# Patient Record
Sex: Female | Born: 1965 | Race: White | Hispanic: No | Marital: Married | Smoking: Never smoker
Health system: Southern US, Community
[De-identification: ages and names within clinical notes are randomized; demographics above are authoritative.]

## PROBLEM LIST (undated history)

## (undated) DIAGNOSIS — Z789 Other specified health status: Secondary | ICD-10-CM

## (undated) HISTORY — PX: WISDOM TOOTH EXTRACTION: SHX21

---

## 1999-04-08 ENCOUNTER — Other Ambulatory Visit: Admission: RE | Admit: 1999-04-08 | Discharge: 1999-04-08 | Payer: Self-pay | Admitting: Obstetrics and Gynecology

## 2001-05-29 ENCOUNTER — Other Ambulatory Visit: Admission: RE | Admit: 2001-05-29 | Discharge: 2001-05-29 | Payer: Self-pay | Admitting: Obstetrics and Gynecology

## 2003-09-12 ENCOUNTER — Other Ambulatory Visit: Admission: RE | Admit: 2003-09-12 | Discharge: 2003-09-12 | Payer: Self-pay | Admitting: Obstetrics and Gynecology

## 2006-07-06 ENCOUNTER — Ambulatory Visit (HOSPITAL_COMMUNITY): Admission: RE | Admit: 2006-07-06 | Discharge: 2006-07-06 | Payer: Self-pay | Admitting: Family Medicine

## 2008-03-26 ENCOUNTER — Other Ambulatory Visit: Admission: RE | Admit: 2008-03-26 | Discharge: 2008-03-26 | Payer: Self-pay | Admitting: Obstetrics and Gynecology

## 2008-05-06 IMAGING — CT CT CHEST W/ CM
1 series · 15 of 31 positions shown, 19 images · IV contrast (omnipaque)
Comparison: none

CLINICAL DATA: Abnormal chest x-ray.  Enlarged aorta. 
CHEST CT WITH CONTRAST:
TECHNIQUE: Multidetector CT imaging of the chest was performed following the standard protocol during bolus administration of intravenous contrast.
Contrast:  80 cc Omnipaque 300

[Series 2: chest_routine 5.0 b40f st · axial · 0.59mm/px · z∈[-332,-17]mm · 15 of 69 slices shown, 19 images]
[im 3/69  mediastinal]
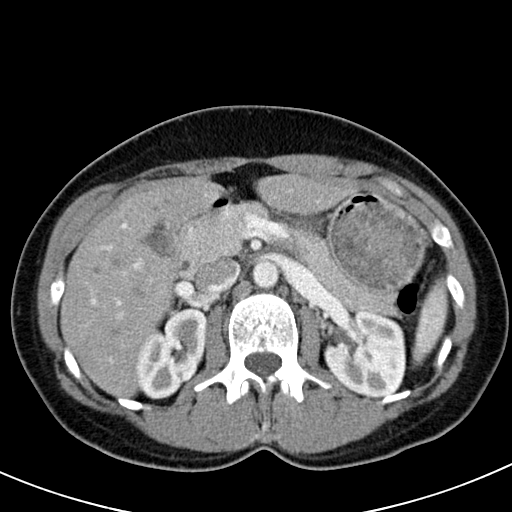
[im 3/69  lung]
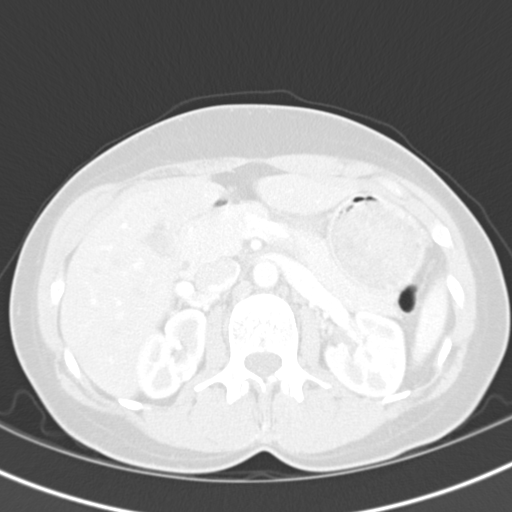
[im 8/69  lung]
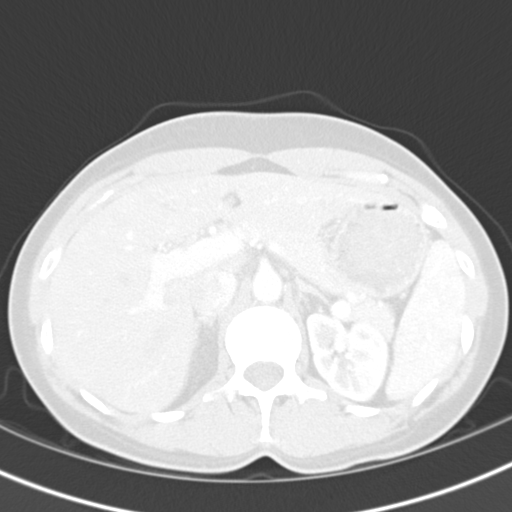
[im 13/69  lung]
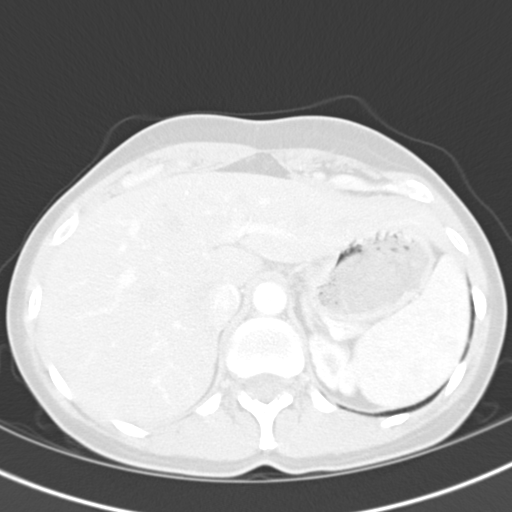
[im 16/69  lung]
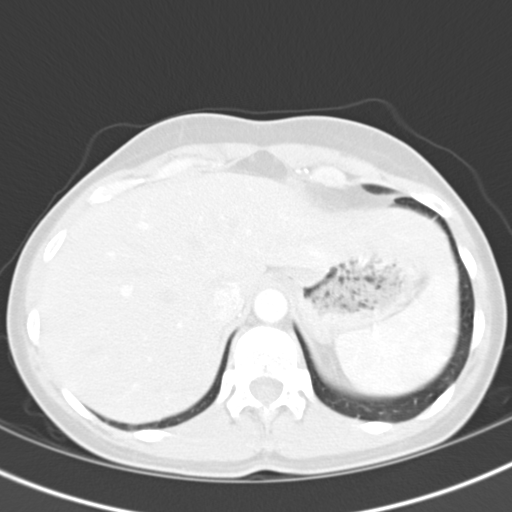
[im 21/69  mediastinal]
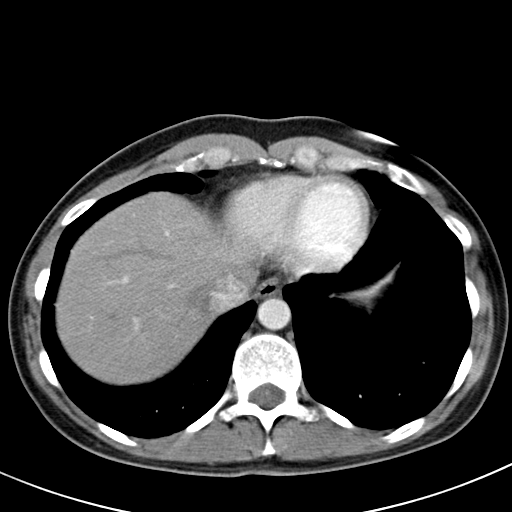
[im 21/69  lung]
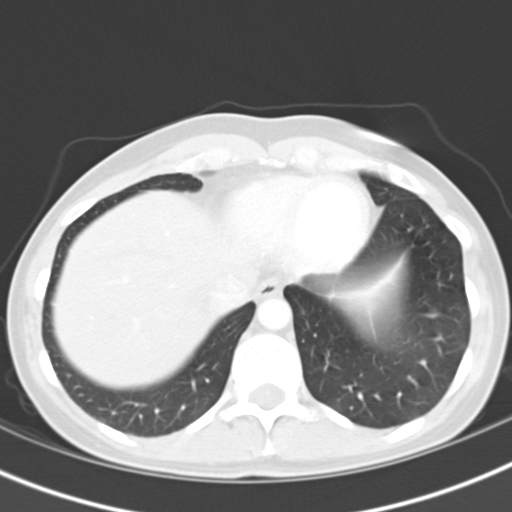
[im 26/69  lung]
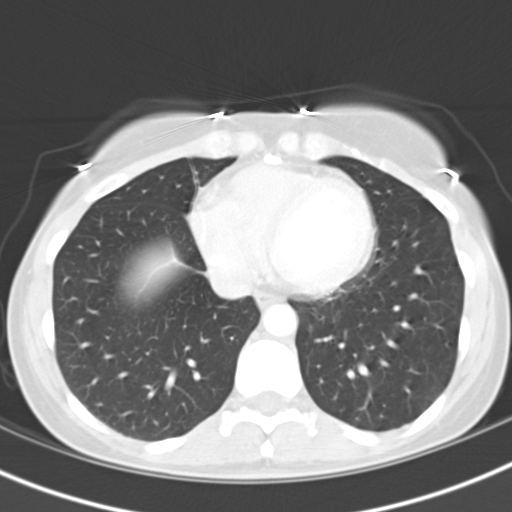
[im 31/69  lung]
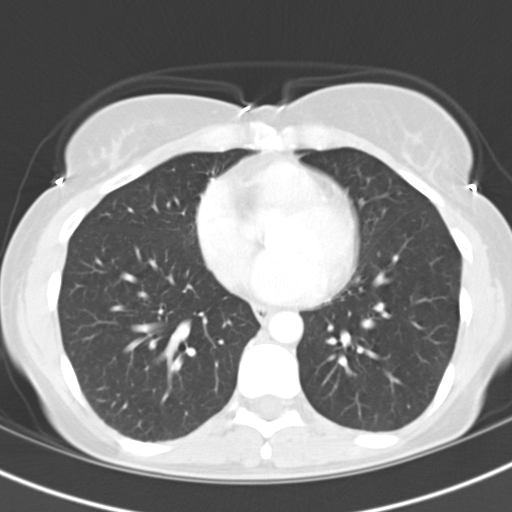
[im 36/69  lung]
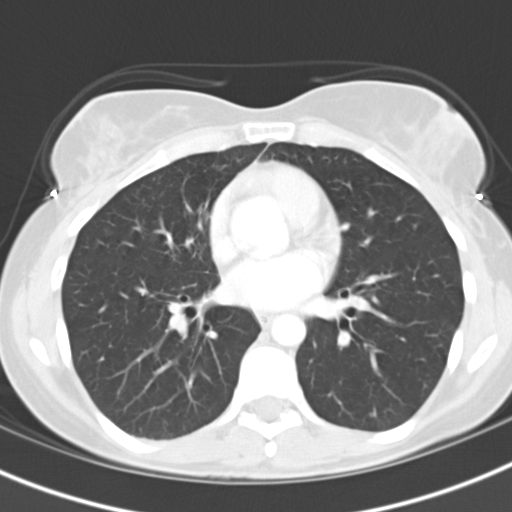
[im 38/69  mediastinal]
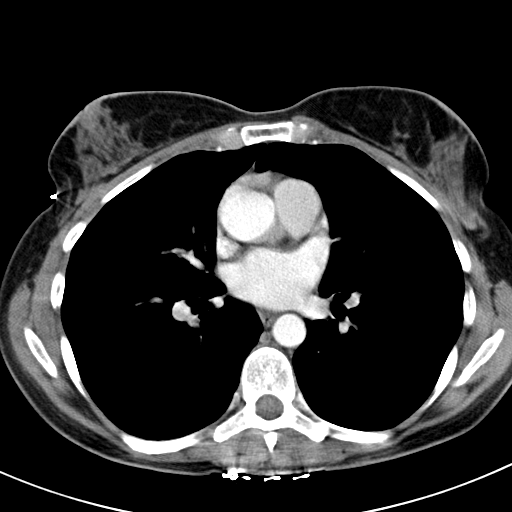
[im 38/69  lung]
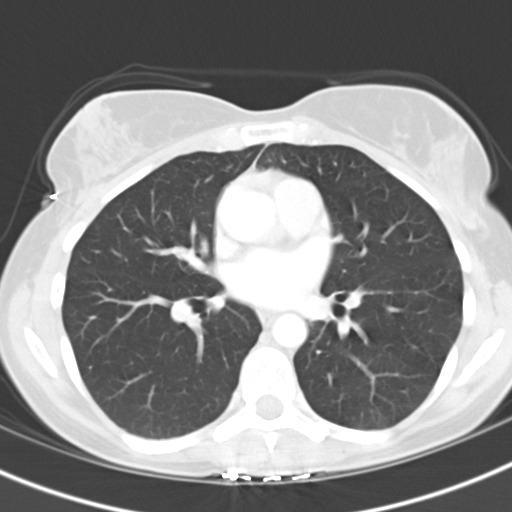
[im 43/69  lung]
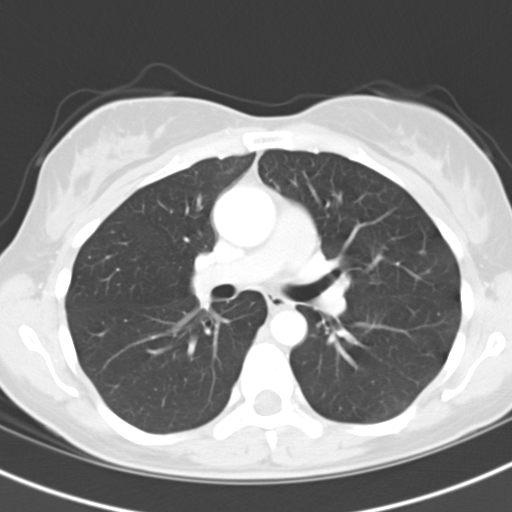
[im 48/69  lung]
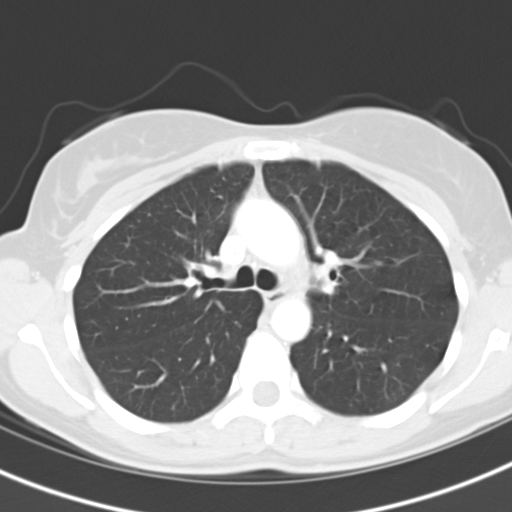
[im 53/69  lung]
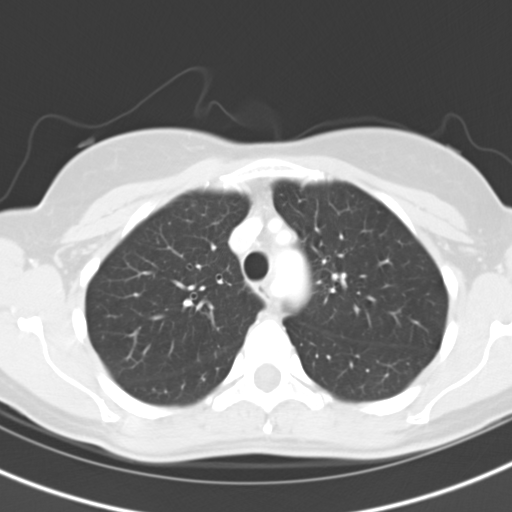
[im 56/69  mediastinal]
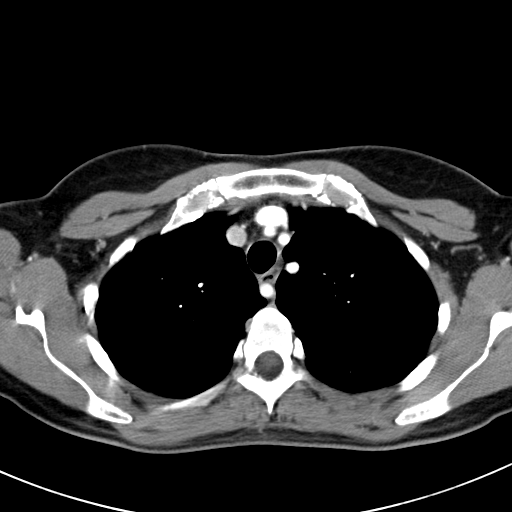
[im 56/69  lung]
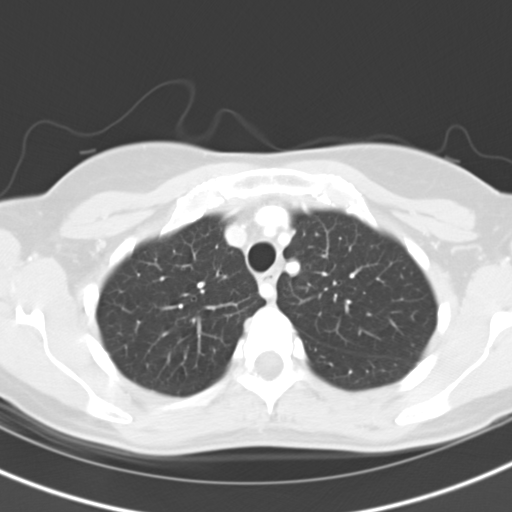
[im 61/69  lung]
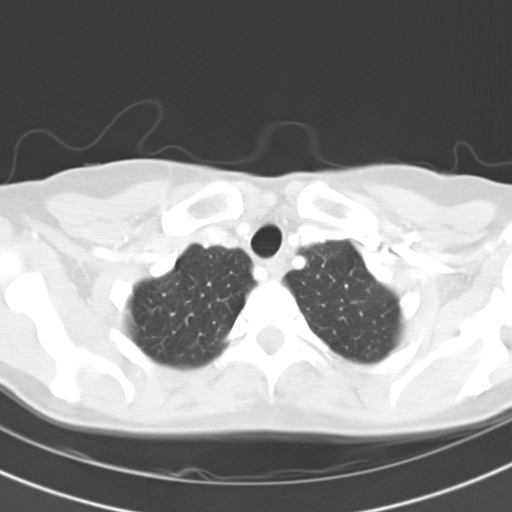
[im 66/69  lung]
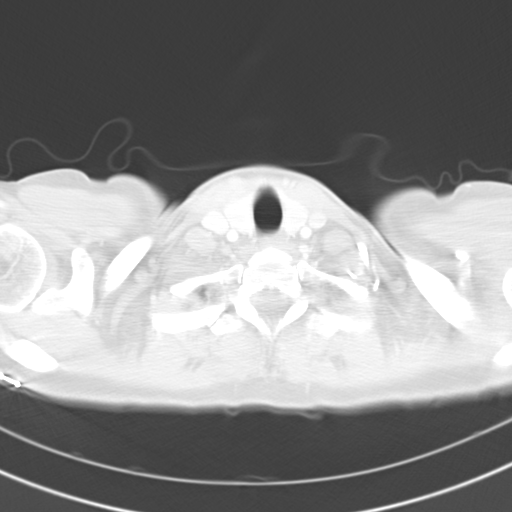

[15 of 31 positions shown; findings below may reference images not displayed]

FINDINGS: The lungs are clear.  No pleural or pericardial fluid.  No hilar or mediastinal adenopathy.  The pulmonary vessels are normal.  The patient has an anomalous origin of the right subclavian artery.  It is possible this could relate to the chest radiographic pattern, though I do not have that for comparison.   Otherwise, the aorta is normal.  No osseous lesion is seen of an acute nature.  There appears to be a chronic calcified thoracic disk herniation in the upper to mid-thoracic region that would not be expected to cause neural compression.
IMPRESSION: No active pathology.  The patient has the congenital variation of an anomalous origin of the right subclavian artery.  It is possible this could result in an abnormal chest radiographic appearance but I do not have those actual images for comparison.  Otherwise, the aorta is normal.

## 2010-05-09 HISTORY — PX: DILATION AND CURETTAGE OF UTERUS: SHX78

## 2010-05-09 NOTE — L&D Delivery Note (Signed)
Delivery Note At 1:18 AM a viable and healthy female was delivered via Vaginal, Spontaneous Delivery (Presentation: Middle Occiput Anterior).  APGAR: , ; weight 5 lb 13.8 oz (2660 g).   Placenta status: Adherent, Manual removal.  Cord: 3 vessels   Pt delivered a vigorous female infant in vertex presentation weighing 5# 13.8 oz with apgars 8 and 9.  Infant cried vigorously after delivery and was passed to maternal abdomen. Cord was clamped and cut.  Placenta was adherant to the fundus and required manual extraction.  Cord insertion appeared to be velamentous.  No lacerations required repair.  Mother and baby are doing well after delivery.  1gram of ancef IV x 1 d/t manual placental extraction  Anesthesia: Epidural  Episiotomy: None Lacerations: abrasion. No repair required  Est. Blood Loss (mL): 450  Mom to postpartum.  Baby to nursery-stable.  Charlcie Prisco H. 03/05/2011, 2:12 AM

## 2010-08-31 LAB — HEPATITIS B SURFACE ANTIGEN: Hepatitis B Surface Ag: NEGATIVE

## 2010-08-31 LAB — ABO/RH: RH Type: POSITIVE

## 2010-08-31 LAB — CBC: Hemoglobin: 14.1 g/dL (ref 12.0–16.0)

## 2010-08-31 LAB — GC/CHLAMYDIA PROBE AMP, GENITAL
Chlamydia: NEGATIVE
Gonorrhea: NEGATIVE

## 2010-08-31 LAB — RUBELLA ANTIBODY, IGM: Rubella: IMMUNE

## 2010-08-31 LAB — ANTIBODY SCREEN: Antibody Screen: NEGATIVE

## 2010-12-23 LAB — CBC: HCT: 41 % (ref 36–46)

## 2010-12-23 LAB — RPR: RPR: NONREACTIVE

## 2011-02-25 ENCOUNTER — Encounter (HOSPITAL_COMMUNITY): Payer: Self-pay

## 2011-02-25 ENCOUNTER — Observation Stay (HOSPITAL_COMMUNITY)
Admission: AD | Admit: 2011-02-25 | Discharge: 2011-02-26 | Disposition: A | Discharge status: Home / Self Care | Payer: BC Managed Care – PPO | Source: Ambulatory Visit | Attending: Obstetrics and Gynecology | Admitting: Obstetrics and Gynecology

## 2011-02-25 DIAGNOSIS — Z3480 Encounter for supervision of other normal pregnancy, unspecified trimester: Secondary | ICD-10-CM

## 2011-02-25 DIAGNOSIS — O09519 Supervision of elderly primigravida, unspecified trimester: Secondary | ICD-10-CM | POA: Insufficient documentation

## 2011-02-25 DIAGNOSIS — O139 Gestational [pregnancy-induced] hypertension without significant proteinuria, unspecified trimester: Principal | ICD-10-CM | POA: Insufficient documentation

## 2011-02-25 DIAGNOSIS — O169 Unspecified maternal hypertension, unspecified trimester: Secondary | ICD-10-CM | POA: Diagnosis present

## 2011-02-25 HISTORY — DX: Other specified health status: Z78.9

## 2011-02-25 LAB — CBC
HCT: 39.8 % (ref 36.0–46.0)
Hemoglobin: 13.5 g/dL (ref 12.0–15.0)
MCH: 31.5 pg (ref 26.0–34.0)
Platelets: 207 10*3/uL (ref 150–400)
RBC: 4.28 MIL/uL (ref 3.87–5.11)
RDW: 13.1 % (ref 11.5–15.5)
WBC: 11.1 10*3/uL — ABNORMAL HIGH (ref 4.0–10.5)

## 2011-02-25 LAB — URINALYSIS, ROUTINE W REFLEX MICROSCOPIC
Bilirubin Urine: NEGATIVE
Glucose, UA: NEGATIVE mg/dL
Ketones, ur: NEGATIVE mg/dL
Leukocytes, UA: NEGATIVE
Nitrite: NEGATIVE
Specific Gravity, Urine: 1.01 (ref 1.005–1.030)
pH: 7 (ref 5.0–8.0)

## 2011-02-25 LAB — COMPREHENSIVE METABOLIC PANEL
ALT: 11 U/L (ref 0–35)
AST: 20 U/L (ref 0–37)
Potassium: 4.1 mEq/L (ref 3.5–5.1)
Sodium: 135 mEq/L (ref 135–145)
Total Protein: 6 g/dL (ref 6.0–8.3)

## 2011-02-25 LAB — URINE MICROSCOPIC-ADD ON

## 2011-02-25 NOTE — H&P (Signed)
H & P Pt is a 45 y/o white female, G1P0 at [redacted] weeks EGA who presents to the ER with elevated B/P.  Pt was seen in the office today and sent over for increased blood pressures. PNC has been complicated by AMA. This pregnancy is the result of IVF with donor eggs(23y/0). Pt initially had a twin pregnancy but had a fetal demise of one twin. Pt also had a placenta previa at 20 weeks which resolved(per pt).  Pt has no HA, vision changes, RUQ pain. Her PIH labs are all normal and her NST is reactive, however her B/Ps remain elevated after bedrest.  Pt will be admitted for observation. PMHx: see [facility] PE: B/P 160-150/90-104 HEENT-wnl Abd- gravid, nontender exts- normal reflexes, no clonus NST- reactive  IMP/ IUP at 36 weeks with PIH         AMA         IVF with donor eggs         Fetal demise of one twin PLAN/ 24 observation

## 2011-02-25 NOTE — Progress Notes (Signed)
Pt was seen in the office today for a regular visit and her blood pressure was elevated. Sent to MAU for evaluation. Pt denies any pain, headache, bleeding, leaking and reports good fetal movement.

## 2011-02-26 NOTE — Progress Notes (Signed)
Hospital Day #2 Pt has no complaints. NST reactive. B/Ps stable, majority have been 130-150/84-95. PIH labs all normal. Plan/ Will discharge home on BR and follow up in office on Monday.

## 2011-02-26 NOTE — Discharge Summary (Signed)
  Pt was admitted for 24 observation because of PIH.  She had no symptoms, normal PIH labs, and a reactive NST.  Pt is now only 36 weeks.  PLAN/ Will discharge to home and follow up on Monday. Will continue [facility] and BR at home.

## 2011-03-01 NOTE — Progress Notes (Signed)
UR Chart review completed.  

## 2011-03-03 ENCOUNTER — Encounter (HOSPITAL_COMMUNITY): Payer: Self-pay | Admitting: *Deleted

## 2011-03-03 ENCOUNTER — Inpatient Hospital Stay (HOSPITAL_COMMUNITY)
Admission: AD | Admit: 2011-03-03 | Discharge: 2011-03-07 | DRG: 372 | Disposition: A | Discharge status: Home / Self Care | Payer: BC Managed Care – PPO | Source: Ambulatory Visit | Attending: Obstetrics & Gynecology | Admitting: Obstetrics & Gynecology

## 2011-03-03 DIAGNOSIS — O09519 Supervision of elderly primigravida, unspecified trimester: Secondary | ICD-10-CM | POA: Diagnosis present

## 2011-03-03 DIAGNOSIS — O30009 Twin pregnancy, unspecified number of placenta and unspecified number of amniotic sacs, unspecified trimester: Secondary | ICD-10-CM

## 2011-03-03 DIAGNOSIS — IMO0002 Reserved for concepts with insufficient information to code with codable children: Principal | ICD-10-CM | POA: Diagnosis present

## 2011-03-03 DIAGNOSIS — Z22330 Carrier of Group B streptococcus: Secondary | ICD-10-CM

## 2011-03-03 DIAGNOSIS — O99892 Other specified diseases and conditions complicating childbirth: Secondary | ICD-10-CM | POA: Diagnosis present

## 2011-03-03 LAB — COMPREHENSIVE METABOLIC PANEL
ALT: 18 U/L (ref 0–35)
AST: 22 U/L (ref 0–37)
Albumin: 2.4 g/dL — ABNORMAL LOW (ref 3.5–5.2)
CO2: 23 mEq/L (ref 19–32)
Chloride: 103 mEq/L (ref 96–112)
GFR calc non Af Amer: 85 mL/min — ABNORMAL LOW (ref >90)
Sodium: 133 mEq/L — ABNORMAL LOW (ref 135–145)
Total Bilirubin: 0.3 mg/dL (ref 0.3–1.2)

## 2011-03-03 LAB — PROTEIN, URINE, 24 HOUR
Collection Interval-UPROT: 24 hours
Protein, 24H Urine: 459 mg/d — ABNORMAL HIGH (ref 50–100)

## 2011-03-03 LAB — CBC
Platelets: 200 10*3/uL (ref 150–400)
RBC: 4.46 MIL/uL (ref 3.87–5.11)
WBC: 12.6 10*3/uL — ABNORMAL HIGH (ref 4.0–10.5)

## 2011-03-03 LAB — STREP B DNA PROBE: GBS: POSITIVE

## 2011-03-03 LAB — URIC ACID: Uric Acid, Serum: 5.8 mg/dL (ref 2.4–7.0)

## 2011-03-03 MED ORDER — ACETAMINOPHEN 325 MG PO TABS: 650.0000 mg | ORAL_TABLET | ORAL | Status: DC | PRN

## 2011-03-03 MED ORDER — OXYTOCIN BOLUS FROM INFUSION
500.0000 mL | Freq: Once | INTRAVENOUS | Status: DC
  Filled 2011-03-03: qty 1000
  Filled 2011-03-03: qty 500

## 2011-03-03 MED ORDER — DOCUSATE SODIUM 100 MG PO CAPS
100.0000 mg | ORAL_CAPSULE | Freq: Every day | ORAL | Status: DC
  Administered 2011-03-05 (×2): 100 mg via ORAL
  Filled 2011-03-03: qty 1

## 2011-03-03 MED ORDER — PENICILLIN G POTASSIUM 5000000 UNITS IJ SOLR
2.5000 10*6.[IU] | INTRAVENOUS | Status: DC
  Administered 2011-03-04 (×6): 2.5 10*6.[IU] via INTRAVENOUS
  Filled 2011-03-03 (×14): qty 2.5

## 2011-03-03 MED ORDER — LACTATED RINGERS IV SOLN
INTRAVENOUS | Status: DC
  Administered 2011-03-03: 125 mL/h via INTRAVENOUS
  Administered 2011-03-04: 08:00:00 via INTRAVENOUS
  Administered 2011-03-04: 125 mL/h via INTRAVENOUS
  Administered 2011-03-04 (×2): via INTRAVENOUS
  Administered 2011-03-05: 500 mL/h via INTRAVENOUS
  Administered 2011-03-05: 07:00:00 via INTRAVENOUS

## 2011-03-03 MED ORDER — CALCIUM CARBONATE ANTACID 500 MG PO CHEW: 2.0000 | CHEWABLE_TABLET | ORAL | Status: DC | PRN

## 2011-03-03 MED ORDER — CITRIC ACID-SODIUM CITRATE 334-500 MG/5ML PO SOLN: 30.0000 mL | ORAL | Status: DC | PRN

## 2011-03-03 MED ORDER — BUTORPHANOL TARTRATE 2 MG/ML IJ SOLN: 1.0000 mg | INTRAMUSCULAR | Status: DC | PRN

## 2011-03-03 MED ORDER — ONDANSETRON HCL 4 MG/2ML IJ SOLN: 4.0000 mg | Freq: Four times a day (QID) | INTRAMUSCULAR | Status: DC | PRN

## 2011-03-03 MED ORDER — OXYCODONE-ACETAMINOPHEN 5-325 MG PO TABS
2.0000 | ORAL_TABLET | ORAL | Status: DC | PRN
  Administered 2011-03-05: 2 via ORAL
  Filled 2011-03-03 (×2): qty 2

## 2011-03-03 MED ORDER — ZOLPIDEM TARTRATE 10 MG PO TABS
10.0000 mg | ORAL_TABLET | Freq: Every evening | ORAL | Status: DC | PRN
  Filled 2011-03-03: qty 1

## 2011-03-03 MED ORDER — OXYTOCIN 20 UNITS IN LACTATED RINGERS INFUSION - SIMPLE
125.0000 mL/h | Freq: Once | INTRAVENOUS | Status: DC
  Filled 2011-03-03: qty 1000

## 2011-03-03 MED ORDER — MISOPROSTOL 25 MCG QUARTER TABLET
25.0000 ug | ORAL_TABLET | ORAL | Status: DC | PRN
  Administered 2011-03-03 (×2): 25 ug via VAGINAL
  Filled 2011-03-03 (×2): qty 0.25
  Filled 2011-03-03: qty 1

## 2011-03-03 MED ORDER — LACTATED RINGERS IV SOLN
500.0000 mL | INTRAVENOUS | Status: DC | PRN
  Administered 2011-03-04: 1000 mL via INTRAVENOUS

## 2011-03-03 MED ORDER — PENICILLIN G POTASSIUM 5000000 UNITS IJ SOLR
5.0000 10*6.[IU] | Freq: Once | INTRAMUSCULAR | Status: AC
  Administered 2011-03-03: 5 10*6.[IU] via INTRAVENOUS
  Filled 2011-03-03: qty 5

## 2011-03-03 MED ORDER — FLEET ENEMA 7-19 GM/118ML RE ENEM: 1.0000 | ENEMA | RECTAL | Status: DC | PRN

## 2011-03-03 MED ORDER — TERBUTALINE SULFATE 1 MG/ML IJ SOLN: 0.2500 mg | Freq: Once | INTRAMUSCULAR | Status: AC | PRN

## 2011-03-03 MED ORDER — IBUPROFEN 600 MG PO TABS
600.0000 mg | ORAL_TABLET | Freq: Four times a day (QID) | ORAL | Status: DC | PRN
  Administered 2011-03-05: 600 mg via ORAL
  Filled 2011-03-03 (×5): qty 1

## 2011-03-03 MED ORDER — PRENATAL PLUS 27-1 MG PO TABS
1.0000 | ORAL_TABLET | Freq: Every day | ORAL | Status: DC
  Administered 2011-03-05 (×2): 1 via ORAL
  Filled 2011-03-03: qty 1

## 2011-03-03 MED ORDER — LIDOCAINE HCL (PF) 1 % IJ SOLN
30.0000 mL | INTRAMUSCULAR | Status: DC | PRN
  Filled 2011-03-03 (×2): qty 30

## 2011-03-03 MED ORDER — ZOLPIDEM TARTRATE 10 MG PO TABS
10.0000 mg | ORAL_TABLET | Freq: Every evening | ORAL | Status: DC | PRN
  Administered 2011-03-04: 10 mg via ORAL

## 2011-03-03 MED ORDER — OXYTOCIN 20 UNITS IN LACTATED RINGERS INFUSION - SIMPLE
1.0000 m[IU]/min | INTRAVENOUS | Status: DC
  Administered 2011-03-04: 10 m[IU]/min via INTRAVENOUS
  Administered 2011-03-04: 3 m[IU]/min via INTRAVENOUS
  Administered 2011-03-04: 1 m[IU]/min via INTRAVENOUS
  Administered 2011-03-05: 166.667 m[IU]/min via INTRAVENOUS
  Administered 2011-03-05: 333 m[IU]/min via INTRAVENOUS
  Filled 2011-03-03: qty 1000

## 2011-03-03 NOTE — Progress Notes (Signed)
BP remains elevated and 24 hour U/A Protein 459.  Have discussed with Dr. Sherrie George and Dr. Fredia Sorrow and will begin induction this PM with cytotec.  Will transfer to L&D around 7PM.

## 2011-03-03 NOTE — Progress Notes (Signed)
Dr. Aldona Bar at the bedside and notifed of pt status, FHR, UC pattern, SVE, and maternal BP. Orders received. Will continue to monitor.

## 2011-03-03 NOTE — H&P (Signed)
45 year old G1 at 36 weeks and 6 days ([facility] 11/16) admitted for Preeclampsia.  Admitted 1 week ago for 24 hours for same.  In office on 10/22 BP140/98.  Today BP 150-160/100, 1+ Proteinuria.  NST in office reactive.  No significant other symptoms.   This pregnancy was IVF with Donor Eggs and initially was a Twin Pregnancy but there was demise of one. +GBS  No allergies Only med is vitamins.  PE  BP 150/100 Chest Clear CV-  RR no murmur ABD-  S+35  VTX  Reactive NST (in office today) EXT:  No edema, Reflexes normal  IMP:  36 week and 6 day IUP  AMA/IVF Pregnancy  Preeclampsia  +GBS  Plan:  Admit to Antenatal.  24 hr. Total Protein submitted.  Labs sent.  Have discussed with MFM (Dr. Sherrie George) and plan is to consider induction soon--today or tomorrow, pending labs, etc.

## 2011-03-04 ENCOUNTER — Inpatient Hospital Stay (HOSPITAL_COMMUNITY): Payer: BC Managed Care – PPO | Admitting: Anesthesiology

## 2011-03-04 ENCOUNTER — Encounter (HOSPITAL_COMMUNITY): Payer: Self-pay | Admitting: Anesthesiology

## 2011-03-04 LAB — COMPREHENSIVE METABOLIC PANEL
AST: 27 U/L (ref 0–37)
CO2: 21 mEq/L (ref 19–32)
Creatinine, Ser: 0.76 mg/dL (ref 0.50–1.10)
GFR calc non Af Amer: >90 mL/min (ref >90)
Potassium: 4.2 mEq/L (ref 3.5–5.1)
Sodium: 132 mEq/L — ABNORMAL LOW (ref 135–145)
Total Bilirubin: 0.3 mg/dL (ref 0.3–1.2)

## 2011-03-04 LAB — CBC
MCH: 31.3 pg (ref 26.0–34.0)
MCH: 30.9 pg (ref 26.0–34.0)
MCHC: 33.7 g/dL (ref 30.0–36.0)
MCHC: 33.6 g/dL (ref 30.0–36.0)
Platelets: 187 10*3/uL (ref 150–400)
RBC: 4.43 MIL/uL (ref 3.87–5.11)
RDW: 13.2 % (ref 11.5–15.5)

## 2011-03-04 LAB — RPR: RPR Ser Ql: NONREACTIVE

## 2011-03-04 MED ORDER — LACTATED RINGERS IV SOLN
500.0000 mL | Freq: Once | INTRAVENOUS | Status: AC
  Administered 2011-03-04: 500 mL via INTRAVENOUS

## 2011-03-04 MED ORDER — EPHEDRINE 5 MG/ML INJ
10.0000 mg | INTRAVENOUS | Status: DC | PRN
  Filled 2011-03-04 (×2): qty 4

## 2011-03-04 MED ORDER — DIPHENHYDRAMINE HCL 50 MG/ML IJ SOLN: 12.5000 mg | INTRAMUSCULAR | Status: DC | PRN

## 2011-03-04 MED ORDER — LIDOCAINE HCL 1.5 % IJ SOLN
INTRAMUSCULAR | Status: DC | PRN
  Administered 2011-03-04 (×2): 5 mL via EPIDURAL

## 2011-03-04 MED ORDER — LABETALOL HCL 5 MG/ML IV SOLN
10.0000 mg | Freq: Once | INTRAVENOUS | Status: AC
  Administered 2011-03-04: 10 mg via INTRAVENOUS
  Filled 2011-03-04: qty 4

## 2011-03-04 MED ORDER — FENTANYL 2.5 MCG/ML BUPIVACAINE 1/10 % EPIDURAL INFUSION (WH - ANES)
14.0000 mL/h | INTRAMUSCULAR | Status: DC
  Administered 2011-03-04 (×3): 14 mL/h via EPIDURAL
  Filled 2011-03-04 (×4): qty 60

## 2011-03-04 MED ORDER — PHENYLEPHRINE 40 MCG/ML (10ML) SYRINGE FOR IV PUSH (FOR BLOOD PRESSURE SUPPORT)
80.0000 ug | PREFILLED_SYRINGE | INTRAVENOUS | Status: DC | PRN
  Filled 2011-03-04: qty 5

## 2011-03-04 MED ORDER — EPHEDRINE 5 MG/ML INJ
10.0000 mg | INTRAVENOUS | Status: DC | PRN
  Filled 2011-03-04: qty 4

## 2011-03-04 MED ORDER — PHENYLEPHRINE 40 MCG/ML (10ML) SYRINGE FOR IV PUSH (FOR BLOOD PRESSURE SUPPORT)
80.0000 ug | PREFILLED_SYRINGE | INTRAVENOUS | Status: DC | PRN
  Filled 2011-03-04 (×2): qty 5

## 2011-03-04 MED ORDER — FENTANYL 2.5 MCG/ML BUPIVACAINE 1/10 % EPIDURAL INFUSION (WH - ANES)
INTRAMUSCULAR | Status: DC | PRN
  Administered 2011-03-04: 14 mL/h via EPIDURAL

## 2011-03-04 NOTE — Anesthesia Preprocedure Evaluation (Signed)
Anesthesia Evaluation  Patient identified by MRN, date of birth, ID band Patient awake  General Assessment Comment  Reviewed: Allergy & Precautions, H&P , NPO status , Patient's Chart, lab work & pertinent test results  Airway Mallampati: I TM Distance: >3 FB Neck ROM: full    Dental No notable dental hx.    Pulmonary    Pulmonary exam normal       Cardiovascular     Neuro/Psych Negative Neurological ROS  Negative Psych ROS   GI/Hepatic negative GI ROS Neg liver ROS    Endo/Other  Negative Endocrine ROS  Renal/GU negative Renal ROS  Genitourinary negative   Musculoskeletal negative musculoskeletal ROS (+)   Abdominal Normal abdominal exam  (+)   Peds negative pediatric ROS (+)  Hematology negative hematology ROS (+)   Anesthesia Other Findings   Reproductive/Obstetrics (+) Pregnancy                           Anesthesia Physical Anesthesia Plan  ASA: II  Anesthesia Plan: Epidural   Post-op Pain Management:    Induction:   Airway Management Planned:   Additional Equipment:   Intra-op Plan:   Post-operative Plan:   Informed Consent: I have reviewed the patients History and Physical, chart, labs and discussed the procedure including the risks, benefits and alternatives for the proposed anesthesia with the patient or authorized representative who has indicated his/her understanding and acceptance.     Plan Discussed with:   Anesthesia Plan Comments:         Anesthesia Quick Evaluation  

## 2011-03-04 NOTE — Anesthesia Procedure Notes (Signed)
Epidural Patient location during procedure: OB Start time: 03/04/2011 1:00 PM End time: 03/04/2011 1:06 PM Reason for block: procedure for pain  Staffing Anesthesiologist: Sandrea Hughs Performed by: anesthesiologist   Preanesthetic Checklist Completed: patient identified, site marked, surgical consent, pre-op evaluation, timeout performed, IV checked, risks and benefits discussed and monitors and equipment checked  Epidural Patient position: sitting Prep: site prepped and draped and DuraPrep Patient monitoring: continuous pulse ox and blood pressure Approach: midline Injection technique: LOR air  Needle:  Needle type: Tuohy  Needle gauge: 17 G Needle length: 9 cm Needle insertion depth: 6 cm Catheter type: closed end flexible Catheter size: 19 Gauge Catheter at skin depth: 11 cm Test dose: negative and 1.5% lidocaine  Assessment Sensory level: T8 Events: blood not aspirated, injection not painful, no injection resistance, negative IV test and no paresthesia

## 2011-03-04 NOTE — Progress Notes (Signed)
  Labor Progress Note  Still comfortable Filed Vitals:   03/04/11 2032 03/04/11 2102 03/04/11 2132 03/04/11 2144  BP: 149/84 153/87 150/88 148/91  Pulse: 100 98 96 98  Temp:    97.7 F (36.5 C)  TempSrc:    Oral  Resp: 18 18  18   Height:      Weight:      SpO2:       Cvx 5/80/0 FHT 140-150 + accels no decels, reactive toco Q 3  IUPC placed without difficulty. Contractions inadequate Increase pitocin for adequate labor

## 2011-03-04 NOTE — Progress Notes (Signed)
  Labor PN  Comfortable Filed Vitals:   03/04/11 1553 03/04/11 1554 03/04/11 1601 03/04/11 1631  BP:  140/87 142/88 137/92  Pulse:  93 95 87  Temp: 97.6 F (36.4 C)     TempSrc: Oral     Resp:   18   Height:      Weight:      SpO2:        Dilation: 3 Effacement (%): 80 Cervical Position: Anterior Station: -2 Presentation: Vertex Exam by:: Houston Surges, md  FHT 140-150 + accels, no decels, reactive toco q2-3  A/P Indxn for pre-eclampsia Continue pit fwb reassuring BPs improved after epidural placement

## 2011-03-04 NOTE — Progress Notes (Signed)
  Labor PN  Confortable, feeling some contractions  Filed Vitals:   03/04/11 1102 03/04/11 1103 03/04/11 1132 03/04/11 1134  BP: 149/83  148/101 148/101  Pulse: 85  93 93  Temp:  97.6 F (36.4 C)    TempSrc:  Oral    Resp:  18    Height:      Weight:       Cvx 1/50/-2 toco irregular FHT 140-160 reactive with accels no decels, reactive  Arom clear fluid  A/P FWB reassuring Arom clear fluid Cont pit

## 2011-03-05 ENCOUNTER — Encounter (HOSPITAL_COMMUNITY): Payer: Self-pay | Admitting: *Deleted

## 2011-03-05 ENCOUNTER — Other Ambulatory Visit: Payer: Self-pay | Admitting: Obstetrics and Gynecology

## 2011-03-05 LAB — CBC
HCT: 31.4 % — ABNORMAL LOW (ref 36.0–46.0)
Hemoglobin: 10.6 g/dL — ABNORMAL LOW (ref 12.0–15.0)
MCHC: 33.8 g/dL (ref 30.0–36.0)
MCV: 93.7 fL (ref 78.0–100.0)
RDW: 13.3 % (ref 11.5–15.5)

## 2011-03-05 MED ORDER — DIPHENHYDRAMINE HCL 25 MG PO CAPS: 25.0000 mg | ORAL_CAPSULE | Freq: Four times a day (QID) | ORAL | Status: DC | PRN

## 2011-03-05 MED ORDER — LANOLIN HYDROUS EX OINT: TOPICAL_OINTMENT | CUTANEOUS | Status: DC | PRN

## 2011-03-05 MED ORDER — HYDROMORPHONE BOLUS VIA INFUSION
1.0000 mg | INTRAVENOUS | Status: DC | PRN
  Filled 2011-03-05: qty 200

## 2011-03-05 MED ORDER — HYDROMORPHONE HCL 1 MG/ML IJ SOLN
INTRAMUSCULAR | Status: AC
  Administered 2011-03-05: 1 mg via INTRAVENOUS
  Filled 2011-03-05: qty 1

## 2011-03-05 MED ORDER — DIBUCAINE 1 % RE OINT: 1.0000 "application " | TOPICAL_OINTMENT | RECTAL | Status: DC | PRN

## 2011-03-05 MED ORDER — BENZOCAINE-MENTHOL 20-0.5 % EX AERO: 1.0000 "application " | INHALATION_SPRAY | CUTANEOUS | Status: DC | PRN

## 2011-03-05 MED ORDER — ONDANSETRON HCL 4 MG PO TABS: 4.0000 mg | ORAL_TABLET | ORAL | Status: DC | PRN

## 2011-03-05 MED ORDER — IBUPROFEN 600 MG PO TABS
600.0000 mg | ORAL_TABLET | Freq: Four times a day (QID) | ORAL | Status: DC
  Administered 2011-03-05 – 2011-03-07 (×10): 600 mg via ORAL
  Filled 2011-03-05 (×5): qty 1

## 2011-03-05 MED ORDER — INFLUENZA VIRUS VACC SPLIT PF IM SUSP
0.5000 mL | Freq: Once | INTRAMUSCULAR | Status: AC
  Administered 2011-03-05: 0.5 mL via INTRAMUSCULAR
  Filled 2011-03-05: qty 0.5

## 2011-03-05 MED ORDER — ONDANSETRON HCL 4 MG/2ML IJ SOLN: 4.0000 mg | INTRAMUSCULAR | Status: DC | PRN

## 2011-03-05 MED ORDER — LABETALOL HCL 200 MG PO TABS
200.0000 mg | ORAL_TABLET | Freq: Two times a day (BID) | ORAL | Status: DC
  Administered 2011-03-06 – 2011-03-07 (×2): 200 mg via ORAL
  Filled 2011-03-05 (×6): qty 1

## 2011-03-05 MED ORDER — SIMETHICONE 80 MG PO CHEW
80.0000 mg | CHEWABLE_TABLET | ORAL | Status: DC | PRN
  Administered 2011-03-06 (×2): 80 mg via ORAL

## 2011-03-05 MED ORDER — PRENATAL PLUS 27-1 MG PO TABS
1.0000 | ORAL_TABLET | Freq: Every day | ORAL | Status: DC
  Administered 2011-03-06 – 2011-03-07 (×2): 1 via ORAL
  Filled 2011-03-05 (×2): qty 1

## 2011-03-05 MED ORDER — LACTATED RINGERS IV SOLN: INTRAVENOUS | Status: AC

## 2011-03-05 MED ORDER — SODIUM CHLORIDE 0.9 % IV SOLN: 1.0000 mg/h | Freq: Once | INTRAVENOUS | Status: DC

## 2011-03-05 MED ORDER — SENNOSIDES-DOCUSATE SODIUM 8.6-50 MG PO TABS
2.0000 | ORAL_TABLET | Freq: Every day | ORAL | Status: DC
  Administered 2011-03-05 – 2011-03-06 (×2): 2 via ORAL

## 2011-03-05 MED ORDER — CEFAZOLIN SODIUM 1-5 GM-% IV SOLN: 1.0000 g | Freq: Three times a day (TID) | INTRAVENOUS | Status: DC

## 2011-03-05 MED ORDER — ZOLPIDEM TARTRATE 5 MG PO TABS: 5.0000 mg | ORAL_TABLET | Freq: Every evening | ORAL | Status: DC | PRN

## 2011-03-05 MED ORDER — CEFAZOLIN SODIUM 1-5 GM-% IV SOLN
1.0000 g | Freq: Once | INTRAVENOUS | Status: AC
  Administered 2011-03-05: 1 g via INTRAVENOUS
  Filled 2011-03-05: qty 50

## 2011-03-05 MED ORDER — TETANUS-DIPHTH-ACELL PERTUSSIS 5-2.5-18.5 LF-MCG/0.5 IM SUSP: 0.5000 mL | Freq: Once | INTRAMUSCULAR | Status: DC

## 2011-03-05 MED ORDER — WITCH HAZEL-GLYCERIN EX PADS: 1.0000 "application " | MEDICATED_PAD | CUTANEOUS | Status: DC | PRN

## 2011-03-05 MED ORDER — MISOPROSTOL 200 MCG PO TABS
ORAL_TABLET | ORAL | Status: AC
  Administered 2011-03-05: 800 ug
  Filled 2011-03-05: qty 4

## 2011-03-05 MED ORDER — OXYCODONE-ACETAMINOPHEN 5-325 MG PO TABS: 1.0000 | ORAL_TABLET | ORAL | Status: DC | PRN

## 2011-03-05 NOTE — Anesthesia Postprocedure Evaluation (Signed)
  Anesthesia Post-op Note  Patient: Paula Rocha  Procedure(s) Performed: * No procedures listed *  Patient Location: Mother/Baby  Anesthesia Type: Epidural  Level of Consciousness: awake, alert  and oriented  Airway and Oxygen Therapy: Patient Spontanous Breathing  Post-op Pain: mild  Post-op Assessment: Patient's Cardiovascular Status Stable and Respiratory Function Stable  Post-op Vital Signs: stable  Complications: No apparent anesthesia complications

## 2011-03-05 NOTE — Progress Notes (Signed)
Post Partum Day 0 Subjective: no complaints, up ad lib, voiding and tolerating PO No further episodes of severe back pain.  Bleeding has been well controlled.  Objective:  Filed Vitals:   03/05/11 0511 03/05/11 0600 03/05/11 0700 03/05/11 1200  BP: 129/84 145/91 153/100 137/85  Pulse: 82 91 96 102  Temp:  97.1 F (36.2 C) 97.5 F (36.4 C) 98 F (36.7 C)  TempSrc:  Oral Oral Oral  Resp: 18 18 18 18   Height:      Weight:      SpO2:        Physical Exam:  General: alert, cooperative, appears stated age and no distress Lochia: appropriate Uterine Fundus: firm DVT Evaluation: No evidence of DVT seen on physical exam.   Basename 03/05/11 0327 03/04/11 1212  HGB 10.6* 13.7  HCT 31.4* 40.8    Assessment/Plan: Breastfeeding Continue routine post-operative care CBC in am   LOS: 2 days   Xitlali Kastens H. 03/05/2011, 3:02 PM

## 2011-03-05 NOTE — Progress Notes (Signed)
  Postpartum Progress Note  Called to see patient for back pain and passage of large clot of blood. Uterus was firm however with uterine massage more clot passed. At one point the patient was noted to be hypotensive. Upon arrival a bimanual exam was performed which demonstrated a large amount of clot in the vagina. This clot was evacuated, however it was apparent that more clot was present further up the uterus. The patient was given milligram of Dilaudid for analgesia and a bimanual exam was performed for manual extraction of approximately 500 cc of clotted blood. Following the exam the patient felt much better from a back pain standpoint. Uterus was firm and bleeding was minimal.  Filed Vitals:   03/05/11 0402  BP: 157/76  Pulse: 107  Temp:   Resp:    CBC    Component Value Date/Time   WBC 21.3* 03/05/2011 0327   RBC 3.35* 03/05/2011 0327   HGB 10.6* 03/05/2011 0327   HCT 31.4* 03/05/2011 0327   PLT 173 03/05/2011 0327   MCV 93.7 03/05/2011 0327   MCH 31.6 03/05/2011 0327   MCHC 33.8 03/05/2011 0327   RDW 13.3 03/05/2011 0327

## 2011-03-06 LAB — CBC
MCH: 31 pg (ref 26.0–34.0)
MCHC: 32.9 g/dL (ref 30.0–36.0)
Platelets: 201 10*3/uL (ref 150–400)
RBC: 2.58 MIL/uL — ABNORMAL LOW (ref 3.87–5.11)
RDW: 13.7 % (ref 11.5–15.5)

## 2011-03-06 LAB — COMPREHENSIVE METABOLIC PANEL
ALT: 12 U/L (ref 0–35)
AST: 24 U/L (ref 0–37)
Albumin: 1.7 g/dL — ABNORMAL LOW (ref 3.5–5.2)
CO2: 23 mEq/L (ref 19–32)
Calcium: 8.5 mg/dL (ref 8.4–10.5)
Sodium: 136 mEq/L (ref 135–145)
Total Protein: 4.3 g/dL — ABNORMAL LOW (ref 6.0–8.3)

## 2011-03-06 NOTE — Progress Notes (Signed)
Post Partum Day 1 Subjective: no complaints, up ad lib, voiding and tolerating PO  Objective: Blood pressure 114/76, pulse 96, temperature 98.1 F (36.7 C), temperature source Oral, resp. rate 18, height 5\' 6"  (1.676 m), weight 80.287 kg (177 lb), SpO2 99.00%, unknown if currently breastfeeding.  Physical Exam:  General: alert, cooperative, appears stated age and no distress Lochia: appropriate Uterine Fundus: firm DVT Evaluation: No evidence of DVT seen on physical exam.   Basename 03/06/11 0538 03/05/11 0327  HGB 8.0* 10.6*  HCT 24.3* 31.4*    Assessment/Plan: Plan for discharge tomorrow BPs controlled with labetalol 200mg  BID Expected Hgb decline given PP hemorrhage.  Pt asymptomatic. Plan iron supplementation on D/C home. Neonatal circ desired. R/B/A reviewed.  Baby breastfeeding poorly currently. Nursery & lactation request to defer circ until tomorrow 10/29.   LOS: 3 days   Shaney Deckman H. 03/06/2011, 1:20 PM

## 2011-03-07 MED ORDER — DSS 100 MG PO CAPS: 100.0000 mg | ORAL_CAPSULE | Freq: Two times a day (BID) | ORAL | Status: AC | PRN

## 2011-03-07 MED ORDER — IBUPROFEN 600 MG PO TABS: 600.0000 mg | ORAL_TABLET | Freq: Four times a day (QID) | ORAL | Status: AC

## 2011-03-07 MED ORDER — OXYCODONE-ACETAMINOPHEN 5-325 MG PO TABS: 1.0000 | ORAL_TABLET | ORAL | Status: DC | PRN

## 2011-03-07 MED ORDER — FERROUS SULFATE 325 (65 FE) MG PO TABS: 325.0000 mg | ORAL_TABLET | Freq: Two times a day (BID) | ORAL | Status: DC

## 2011-03-07 MED ORDER — BENZOCAINE-MENTHOL 20-0.5 % EX AERO
INHALATION_SPRAY | CUTANEOUS | Status: AC
  Filled 2011-03-07: qty 56

## 2011-03-07 MED ORDER — DOCUSATE SODIUM 100 MG PO CAPS: 100.0000 mg | ORAL_CAPSULE | Freq: Two times a day (BID) | ORAL | Status: DC | PRN

## 2011-03-07 MED ORDER — LABETALOL HCL 200 MG PO TABS: 200.0000 mg | ORAL_TABLET | Freq: Two times a day (BID) | ORAL | Status: DC

## 2011-03-07 NOTE — Discharge Summary (Signed)
Obstetric Discharge Summary Reason for Admission: preeclampsia Prenatal Procedures: none Intrapartum Procedures: spontaneous vaginal delivery Postpartum Procedures: none Complications-Operative and Postpartum: none Hemoglobin  Date Value Range Status  03/06/2011 8.0* 12.0-15.0 (g/dL) Final     REPEATED TO VERIFY     DELTA CHECK NOTED     HCT  Date Value Range Status  03/06/2011 24.3* 36.0-46.0 (%) Final    Discharge Diagnoses: Term Pregnancy-delivered  Discharge Information: Date: 03/07/2011 Activity: unrestricted Diet: routine Medications: Ibuprofen, Colace and Iron Condition: stable Instructions: refer to practice specific booklet Discharge to: home Follow-up Information    Follow up with ROSS,KENDRA H. in 1 week. (blood pressure check)    Contact information:   [address] [place] [address] [phone]          Newborn Data: Live born female  Birth Weight: 5 lb 13.8 oz (2659 g) APGAR: 8, 9 Baby remaining in nursery for feeding issues.Claiborne Billings, Amanee Iacovelli (MICHELLE) 03/07/2011, 10:24 AM

## 2011-03-08 NOTE — Progress Notes (Signed)
UR Chart review completed.  

## 2012-01-17 ENCOUNTER — Other Ambulatory Visit: Payer: Self-pay | Admitting: Obstetrics and Gynecology

## 2012-05-09 NOTE — L&D Delivery Note (Signed)
Delivery Note At 9:38 PM a viable and healthy female was delivered via Vaginal, Spontaneous Delivery (Presentation: Right; Occiput Anterior).  APGAR: 8, 9; weight pending.   Placenta status: Spontaneous, Intact, with marginal cord insertion and succenturiate lobe.  Cord:3V with a subjectively short cord  With delivery of the infants head the shoulders did not immediately deliver. The head restituted to the right and the patient placed in [place] but was involuntarily closing her legs due to discomfort.  The anterior shoulder was delivered easily followed by the posterior shoulder and body. Total length of dystocia was approximately 15 seconds. The infant was passed to the maternal abdomen.  The cord was clamped and cut early due to the short length of the cord.  The placenta was then delivered spontaneously, intact with 3 vessel cord. A marginal cord insertion and succenturiate lobe was noted.  No lacerations required repair.  Mother and baby are doing well after delivery.  Anesthesia: Epidural  Episiotomy: None Lacerations: None  Suture Repair: None Est. Blood Loss (mL): 400 cc  Mom to postpartum.  Baby to nursery-stable.  Antjuan Rothe H. 12/14/2012, 10:04 PM

## 2012-08-14 LAB — OB RESULTS CONSOLE GC/CHLAMYDIA: Chlamydia: NEGATIVE

## 2012-08-14 LAB — OB RESULTS CONSOLE ABO/RH

## 2012-08-14 LAB — OB RESULTS CONSOLE RPR: RPR: NONREACTIVE

## 2012-08-22 ENCOUNTER — Telehealth: Payer: Self-pay | Admitting: Nurse Practitioner

## 2012-08-22 NOTE — Telephone Encounter (Signed)
PT AWARE WE HAVE NO AVAILABLE APPTS

## 2012-08-30 LAB — OB RESULTS CONSOLE HIV ANTIBODY (ROUTINE TESTING): HIV: NONREACTIVE

## 2012-12-14 ENCOUNTER — Inpatient Hospital Stay (HOSPITAL_COMMUNITY): Payer: BC Managed Care – PPO | Admitting: Anesthesiology

## 2012-12-14 ENCOUNTER — Encounter (HOSPITAL_COMMUNITY): Payer: Self-pay | Admitting: Anesthesiology

## 2012-12-14 ENCOUNTER — Encounter (HOSPITAL_COMMUNITY): Payer: Self-pay

## 2012-12-14 ENCOUNTER — Encounter (HOSPITAL_COMMUNITY): Payer: Self-pay | Admitting: *Deleted

## 2012-12-14 ENCOUNTER — Telehealth (HOSPITAL_COMMUNITY): Payer: Self-pay | Admitting: *Deleted

## 2012-12-14 ENCOUNTER — Inpatient Hospital Stay (HOSPITAL_COMMUNITY)
Admission: RE | Admit: 2012-12-14 | Discharge: 2012-12-16 | DRG: 373 | Disposition: A | Discharge status: Home / Self Care | Payer: BC Managed Care – PPO | Source: Ambulatory Visit | Attending: Obstetrics and Gynecology | Admitting: Obstetrics and Gynecology

## 2012-12-14 DIAGNOSIS — O09529 Supervision of elderly multigravida, unspecified trimester: Secondary | ICD-10-CM | POA: Diagnosis present

## 2012-12-14 DIAGNOSIS — O99892 Other specified diseases and conditions complicating childbirth: Principal | ICD-10-CM | POA: Diagnosis present

## 2012-12-14 DIAGNOSIS — Z22330 Carrier of Group B streptococcus: Secondary | ICD-10-CM

## 2012-12-14 LAB — COMPREHENSIVE METABOLIC PANEL
AST: 15 U/L (ref 0–37)
Albumin: 2.4 g/dL — ABNORMAL LOW (ref 3.5–5.2)
BUN: 14 mg/dL (ref 6–23)
CO2: 22 mEq/L (ref 19–32)
Calcium: 9.4 mg/dL (ref 8.4–10.5)
Creatinine, Ser: 0.67 mg/dL (ref 0.50–1.10)
Glucose, Bld: 76 mg/dL (ref 70–99)
Potassium: 4 mEq/L (ref 3.5–5.1)
Sodium: 137 mEq/L (ref 135–145)
Total Bilirubin: 0.2 mg/dL — ABNORMAL LOW (ref 0.3–1.2)
Total Protein: 5.8 g/dL — ABNORMAL LOW (ref 6.0–8.3)

## 2012-12-14 LAB — TYPE AND SCREEN
ABO/RH(D): A POS
Antibody Screen: NEGATIVE

## 2012-12-14 LAB — CBC
MCH: 30.9 pg (ref 26.0–34.0)
Platelets: 218 10*3/uL (ref 150–400)
RBC: 4.46 MIL/uL (ref 3.87–5.11)
WBC: 10 10*3/uL (ref 4.0–10.5)

## 2012-12-14 LAB — URIC ACID: Uric Acid, Serum: 4 mg/dL (ref 2.4–7.0)

## 2012-12-14 LAB — OB RESULTS CONSOLE RPR: RPR: NONREACTIVE

## 2012-12-14 LAB — LACTATE DEHYDROGENASE: LDH: 154 U/L (ref 94–250)

## 2012-12-14 LAB — RPR: RPR Ser Ql: NONREACTIVE

## 2012-12-14 LAB — OB RESULTS CONSOLE GC/CHLAMYDIA: Chlamydia: NEGATIVE

## 2012-12-14 LAB — ABO/RH: ABO/RH(D): A POS

## 2012-12-14 MED ORDER — LIDOCAINE HCL (PF) 1 % IJ SOLN
30.0000 mL | INTRAMUSCULAR | Status: DC | PRN
  Filled 2012-12-14 (×2): qty 30

## 2012-12-14 MED ORDER — OXYTOCIN 40 UNITS IN LACTATED RINGERS INFUSION - SIMPLE MED
62.5000 mL/h | INTRAVENOUS | Status: DC
  Administered 2012-12-14: 62.5 mL/h via INTRAVENOUS

## 2012-12-14 MED ORDER — PENICILLIN G POTASSIUM 5000000 UNITS IJ SOLR
5.0000 10*6.[IU] | Freq: Once | INTRAVENOUS | Status: AC
  Administered 2012-12-14: 5 10*6.[IU] via INTRAVENOUS
  Filled 2012-12-14: qty 5

## 2012-12-14 MED ORDER — OXYCODONE-ACETAMINOPHEN 5-325 MG PO TABS: 1.0000 | ORAL_TABLET | ORAL | Status: DC | PRN

## 2012-12-14 MED ORDER — LANOLIN HYDROUS EX OINT: TOPICAL_OINTMENT | CUTANEOUS | Status: DC | PRN

## 2012-12-14 MED ORDER — BUTORPHANOL TARTRATE 1 MG/ML IJ SOLN: 1.0000 mg | INTRAMUSCULAR | Status: DC | PRN

## 2012-12-14 MED ORDER — PHENYLEPHRINE 40 MCG/ML (10ML) SYRINGE FOR IV PUSH (FOR BLOOD PRESSURE SUPPORT)
80.0000 ug | PREFILLED_SYRINGE | INTRAVENOUS | Status: DC | PRN
  Filled 2012-12-14: qty 5
  Filled 2012-12-14: qty 2

## 2012-12-14 MED ORDER — EPHEDRINE 5 MG/ML INJ
10.0000 mg | INTRAVENOUS | Status: DC | PRN
  Filled 2012-12-14: qty 2

## 2012-12-14 MED ORDER — LACTATED RINGERS IV SOLN
INTRAVENOUS | Status: DC
  Administered 2012-12-14: 1000 mL via INTRAVENOUS

## 2012-12-14 MED ORDER — EPHEDRINE 5 MG/ML INJ
10.0000 mg | INTRAVENOUS | Status: DC | PRN
  Filled 2012-12-14: qty 2
  Filled 2012-12-14: qty 4

## 2012-12-14 MED ORDER — ZOLPIDEM TARTRATE 5 MG PO TABS: 5.0000 mg | ORAL_TABLET | Freq: Every evening | ORAL | Status: DC | PRN

## 2012-12-14 MED ORDER — IBUPROFEN 600 MG PO TABS: 600.0000 mg | ORAL_TABLET | Freq: Four times a day (QID) | ORAL | Status: DC | PRN

## 2012-12-14 MED ORDER — ONDANSETRON HCL 4 MG/2ML IJ SOLN
4.0000 mg | Freq: Four times a day (QID) | INTRAMUSCULAR | Status: DC | PRN
  Administered 2012-12-14: 4 mg via INTRAVENOUS
  Filled 2012-12-14: qty 2

## 2012-12-14 MED ORDER — TETANUS-DIPHTH-ACELL PERTUSSIS 5-2.5-18.5 LF-MCG/0.5 IM SUSP
0.5000 mL | Freq: Once | INTRAMUSCULAR | Status: AC
  Administered 2012-12-15: 0.5 mL via INTRAMUSCULAR

## 2012-12-14 MED ORDER — SENNOSIDES-DOCUSATE SODIUM 8.6-50 MG PO TABS
2.0000 | ORAL_TABLET | Freq: Every day | ORAL | Status: DC
  Administered 2012-12-14 – 2012-12-15 (×2): 2 via ORAL

## 2012-12-14 MED ORDER — DIBUCAINE 1 % RE OINT: 1.0000 "application " | TOPICAL_OINTMENT | RECTAL | Status: DC | PRN

## 2012-12-14 MED ORDER — OXYTOCIN 40 UNITS IN LACTATED RINGERS INFUSION - SIMPLE MED
1.0000 m[IU]/min | INTRAVENOUS | Status: DC
  Administered 2012-12-14: 2 m[IU]/min via INTRAVENOUS
  Administered 2012-12-14: 10 m[IU]/min via INTRAVENOUS
  Filled 2012-12-14: qty 1000

## 2012-12-14 MED ORDER — PHENYLEPHRINE 40 MCG/ML (10ML) SYRINGE FOR IV PUSH (FOR BLOOD PRESSURE SUPPORT)
80.0000 ug | PREFILLED_SYRINGE | INTRAVENOUS | Status: DC | PRN
  Filled 2012-12-14: qty 2

## 2012-12-14 MED ORDER — LACTATED RINGERS IV SOLN
500.0000 mL | INTRAVENOUS | Status: DC | PRN
  Administered 2012-12-14: 500 mL via INTRAVENOUS

## 2012-12-14 MED ORDER — IBUPROFEN 600 MG PO TABS
600.0000 mg | ORAL_TABLET | Freq: Four times a day (QID) | ORAL | Status: DC
  Administered 2012-12-14 – 2012-12-16 (×6): 600 mg via ORAL
  Filled 2012-12-14 (×6): qty 1

## 2012-12-14 MED ORDER — ACETAMINOPHEN 325 MG PO TABS: 650.0000 mg | ORAL_TABLET | ORAL | Status: DC | PRN

## 2012-12-14 MED ORDER — PRENATAL MULTIVITAMIN CH
1.0000 | ORAL_TABLET | Freq: Every day | ORAL | Status: DC
  Administered 2012-12-15: 1 via ORAL
  Filled 2012-12-14: qty 1

## 2012-12-14 MED ORDER — CITRIC ACID-SODIUM CITRATE 334-500 MG/5ML PO SOLN: 30.0000 mL | ORAL | Status: DC | PRN

## 2012-12-14 MED ORDER — OXYTOCIN BOLUS FROM INFUSION: 500.0000 mL | INTRAVENOUS | Status: DC

## 2012-12-14 MED ORDER — ONDANSETRON HCL 4 MG PO TABS: 4.0000 mg | ORAL_TABLET | ORAL | Status: DC | PRN

## 2012-12-14 MED ORDER — SIMETHICONE 80 MG PO CHEW: 80.0000 mg | CHEWABLE_TABLET | ORAL | Status: DC | PRN

## 2012-12-14 MED ORDER — BENZOCAINE-MENTHOL 20-0.5 % EX AERO: 1.0000 "application " | INHALATION_SPRAY | CUTANEOUS | Status: DC | PRN

## 2012-12-14 MED ORDER — OXYCODONE-ACETAMINOPHEN 5-325 MG PO TABS
1.0000 | ORAL_TABLET | ORAL | Status: DC | PRN
  Administered 2012-12-15: 1 via ORAL
  Filled 2012-12-14: qty 1

## 2012-12-14 MED ORDER — LIDOCAINE HCL (PF) 1 % IJ SOLN
INTRAMUSCULAR | Status: DC | PRN
  Administered 2012-12-14 (×4): 4 mL

## 2012-12-14 MED ORDER — FENTANYL 2.5 MCG/ML BUPIVACAINE 1/10 % EPIDURAL INFUSION (WH - ANES)
14.0000 mL/h | INTRAMUSCULAR | Status: DC | PRN
  Administered 2012-12-14: 14 mL/h via EPIDURAL
  Filled 2012-12-14: qty 125

## 2012-12-14 MED ORDER — DIPHENHYDRAMINE HCL 50 MG/ML IJ SOLN: 12.5000 mg | INTRAMUSCULAR | Status: DC | PRN

## 2012-12-14 MED ORDER — DIPHENHYDRAMINE HCL 25 MG PO CAPS
25.0000 mg | ORAL_CAPSULE | Freq: Four times a day (QID) | ORAL | Status: DC | PRN
  Administered 2012-12-14: 25 mg via ORAL
  Filled 2012-12-14: qty 1

## 2012-12-14 MED ORDER — PENICILLIN G POTASSIUM 5000000 UNITS IJ SOLR
2.5000 10*6.[IU] | INTRAVENOUS | Status: DC
  Administered 2012-12-14: 2.5 10*6.[IU] via INTRAVENOUS
  Filled 2012-12-14 (×5): qty 2.5

## 2012-12-14 MED ORDER — METHYLERGONOVINE MALEATE 0.2 MG PO TABS: 0.2000 mg | ORAL_TABLET | ORAL | Status: DC | PRN

## 2012-12-14 MED ORDER — TERBUTALINE SULFATE 1 MG/ML IJ SOLN: 0.2500 mg | Freq: Once | INTRAMUSCULAR | Status: DC | PRN

## 2012-12-14 MED ORDER — ONDANSETRON HCL 4 MG/2ML IJ SOLN: 4.0000 mg | INTRAMUSCULAR | Status: DC | PRN

## 2012-12-14 MED ORDER — WITCH HAZEL-GLYCERIN EX PADS: 1.0000 "application " | MEDICATED_PAD | CUTANEOUS | Status: DC | PRN

## 2012-12-14 MED ORDER — LACTATED RINGERS IV SOLN: 500.0000 mL | Freq: Once | INTRAVENOUS | Status: DC

## 2012-12-14 MED ORDER — METHYLERGONOVINE MALEATE 0.2 MG/ML IJ SOLN: 0.2000 mg | INTRAMUSCULAR | Status: DC | PRN

## 2012-12-14 NOTE — Anesthesia Procedure Notes (Signed)
Epidural Patient location during procedure: OB Start time: 12/14/2012 3:52 PM  Staffing Performed by: anesthesiologist   Preanesthetic Checklist Completed: patient identified, site marked, surgical consent, pre-op evaluation, timeout performed, IV checked, risks and benefits discussed and monitors and equipment checked  Epidural Patient position: sitting Prep: site prepped and draped and DuraPrep Patient monitoring: continuous pulse ox and blood pressure Approach: midline Injection technique: LOR air  Needle:  Needle type: Tuohy  Needle gauge: 17 G Needle length: 9 cm and 9 Needle insertion depth: 4.5 cm Catheter type: closed end flexible Catheter size: 19 Gauge Catheter at skin depth: 9.5 cm Test dose: negative  Assessment Events: blood not aspirated, injection not painful, no injection resistance, negative IV test and no paresthesia  Additional Notes Discussed risk of headache, infection, bleeding, nerve injury and failed or incomplete block.  Patient voices understanding and wishes to proceed.  Epidural placed easily on first attempt.  No paresthesia. Patient tolerated procedure well with no apparent complications.  Jasmine December, MDReason for block:procedure for pain

## 2012-12-14 NOTE — H&P (Signed)
Paula Rocha is a 47 y.o. female presenting for IOL  47 yo G2P1001 @ 39+5 presents for IOL for term prengancy. She conceived this prengancy with donor eggs. Her pregnancy has been uncomplicated to this point. She had Gestational hypertension with her first pregnancy History OB History   Grav Para Term Preterm Abortions TAB SAB Ect Mult Living   2 1 1  0 0 0 0 0 0 1     Past Medical History  Diagnosis Date  . No pertinent past medical history    Past Surgical History  Procedure Laterality Date  . Dilation and curettage of uterus  05/2010    remove cervical polyps   Family History: family history includes Diabetes in her maternal grandmother; Hypertension in her father; and Stroke in her father. Social History:  reports that she has never smoked. She does not have any smokeless tobacco history on file. She reports that she does not drink alcohol or use illicit drugs.   Prenatal Transfer Tool  Maternal Diabetes: No Genetic Screening: Normal Maternal Ultrasounds/Referrals: Normal Fetal Ultrasounds or other Referrals:  None Maternal Substance Abuse:  No Significant Maternal Medications:  None Significant Maternal Lab Results:  None Other Comments:  None  ROS : as above   Dilation: 3 Effacement (%): 70 Station: -2 Exam by:: Dr Tenny Craw Blood pressure 138/108, pulse 73, temperature 98 F (36.7 C), temperature source Oral, resp. rate 18, height 5\' 7"  (1.702 m), weight 81.647 kg (180 lb), last menstrual period 03/28/2012, unknown if currently breastfeeding. Exam Physical Exam  Prenatal labs: ABO, Rh: --/--/PENDING (08/08 0740) Antibody: NEG (08/08 0740) Rubella: Immune (04/08 0000) RPR: Nonreactive (08/08 0000)  HBsAg: Negative (04/08 0000)  HIV: Non-reactive (04/24 0000)  GBS: Positive (07/08 0000)   Assessment/Plan: 1) Admit 2) PCN for GBS 3) Pitocin 4) AROM when able 5) Epidural on request   Danielly Ackerley H. 12/14/2012, 12:49 PM

## 2012-12-14 NOTE — Telephone Encounter (Signed)
Preadmission screen  

## 2012-12-14 NOTE — Anesthesia Preprocedure Evaluation (Signed)
Anesthesia Evaluation  Patient identified by MRN, date of birth, ID band Patient awake    Reviewed: Allergy & Precautions, H&P , NPO status , Patient's Chart, lab work & pertinent test results, reviewed documented beta blocker date and time   History of Anesthesia Complications Negative for: history of anesthetic complications  Airway Mallampati: II TM Distance: >3 FB Neck ROM: full    Dental  (+) Teeth Intact   Pulmonary neg pulmonary ROS,  breath sounds clear to auscultation        Cardiovascular negative cardio ROS  Rhythm:regular Rate:Normal     Neuro/Psych negative neurological ROS  negative psych ROS   GI/Hepatic negative GI ROS, Neg liver ROS,   Endo/Other  negative endocrine ROS  Renal/GU negative Renal ROS  negative genitourinary   Musculoskeletal   Abdominal   Peds  Hematology negative hematology ROS (+)   Anesthesia Other Findings   Reproductive/Obstetrics (+) Pregnancy                           Anesthesia Physical Anesthesia Plan  ASA: II  Anesthesia Plan: Epidural   Post-op Pain Management:    Induction:   Airway Management Planned:   Additional Equipment:   Intra-op Plan:   Post-operative Plan:   Informed Consent: I have reviewed the patients History and Physical, chart, labs and discussed the procedure including the risks, benefits and alternatives for the proposed anesthesia with the patient or authorized representative who has indicated his/her understanding and acceptance.     Plan Discussed with:   Anesthesia Plan Comments:         Anesthesia Quick Evaluation  

## 2012-12-15 LAB — CBC
Hemoglobin: 11.4 g/dL — ABNORMAL LOW (ref 12.0–15.0)
RBC: 3.74 MIL/uL — ABNORMAL LOW (ref 3.87–5.11)
WBC: 14 10*3/uL — ABNORMAL HIGH (ref 4.0–10.5)

## 2012-12-15 NOTE — Lactation Note (Signed)
This note was copied from the chart of Paula [facility]. Lactation Consultation Note   Initial consult with this mom and baby, now 17 hours post partum. Mom has easy ot express colostrum, but inverted nipples. Mom has not been able to get baby to latch, and Paula Rocha was screaming ain mom's arms when i walked into the room. I had mom recline and try latching baby in cross cradle hold - she was too fussy. i the helped mom hand express 3 mls of colostrum, and showed mom how to spoon feed her. This calmed Paula Rocha down. Baby skin to skin with mom, and mom again tried latching = baby could not maintain latch. I fitted mom for a 24 nipple shield, and Paula Rocha latched, few suckles and then fell asleep. i also left a 20 nipple shield for mom to try - shie used a nipple shileld with her first child, and I reviewed with mom how to apply. Mom knows she can either use manual hand pump or hand express and spoon feed, if baby will not suckle with shiled. I reviewed lactation services with mom, and she knows to call for questions/concerns.  Patient Name: Paula Rocha [id number] Date: 12/15/2012 Reason for consult: Initial assessment   Maternal Data Formula Feeding for Exclusion: No Infant to breast within first hour of birth: Yes Does the patient have breastfeeding experience prior to this delivery?: Yes  Feeding Feeding Type: Breast Milk  LATCH Score/Interventions Latch: Too sleepy or reluctant, no latch achieved, no sucking elicited. (latched w fes suckles after spoon feeding and 24 nipple shil) Intervention(s): Skin to skin;Teach feeding cues;Waking techniques  Audible Swallowing: A few with stimulation  Type of Nipple: Inverted Intervention(s): Hand pump  Comfort (Breast/Nipple): Soft / non-tender     Hold (Positioning): Assistance needed to correctly position infant at breast and maintain latch. Intervention(s): Breastfeeding basics reviewed;Support Pillows;Position options;Skin to skin  LATCH  Score: 4  Lactation Tools Discussed/Used Tools: Nipple Shields Nipple shield size: 20;24   Consult Status Consult Status: Follow-up Date: 12/16/12 Follow-up type: In-patient    Paula Rocha 12/15/2012, 3:16 PM

## 2012-12-15 NOTE — Anesthesia Postprocedure Evaluation (Signed)
Anesthesia Post Note  Patient: Paula Rocha  Procedure(s) Performed: * No procedures listed *  Anesthesia type: Epidural  Patient location: Mother/Baby  Post pain: Pain level controlled  Post assessment: Post-op Vital signs reviewed  Last Vitals:  Filed Vitals:   12/15/12 0430  BP: 101/55  Pulse: 77  Temp: 36.4 C  Resp: 18    Post vital signs: Reviewed  Level of consciousness:alert  Complications: No apparent anesthesia complications

## 2012-12-15 NOTE — Progress Notes (Signed)
Post Partum Day 1 Subjective: no complaints, up ad lib and voiding  Objective: Blood pressure 101/55, pulse 77, temperature 97.6 F (36.4 C), temperature source Oral, resp. rate 18, height 5\' 7"  (1.702 m), weight 81.647 kg (180 lb), last menstrual period 03/28/2012, SpO2 100.00%, unknown if currently breastfeeding.  Physical Exam:  General: alert, cooperative and appears stated age Lochia: appropriate Uterine Fundus: firm   Recent Labs  12/14/12 0740 12/15/12 0610  HGB 13.8 11.4*  HCT 41.6 34.3*    Assessment/Plan: Plan for discharge tomorrow and Breastfeeding   LOS: 1 day   Paula Rocha H. 12/15/2012, 11:20 AM

## 2012-12-16 MED ORDER — IBUPROFEN 600 MG PO TABS: 600.0000 mg | ORAL_TABLET | Freq: Four times a day (QID) | ORAL | Status: AC | PRN

## 2012-12-16 MED ORDER — DOCUSATE SODIUM 100 MG PO CAPS: 100.0000 mg | ORAL_CAPSULE | Freq: Two times a day (BID) | ORAL | Status: AC

## 2012-12-16 MED ORDER — OXYCODONE-ACETAMINOPHEN 5-325 MG PO TABS: 2.0000 | ORAL_TABLET | ORAL | Status: DC | PRN

## 2012-12-16 NOTE — Discharge Summary (Signed)
Obstetric Discharge Summary Reason for Admission: induction of labor Prenatal Procedures: ultrasound Intrapartum Procedures: spontaneous vaginal delivery Postpartum Procedures: none Complications-Operative and Postpartum: none Hemoglobin  Date Value Range Status  12/15/2012 11.4* 12.0 - 15.0 g/dL Final     REPEATED TO VERIFY     DELTA CHECK NOTED     HCT  Date Value Range Status  12/15/2012 34.3* 36.0 - 46.0 % Final    Physical Exam:  General: alert, cooperative and appears stated age 47: appropriate Uterine Fundus: firm  Discharge Diagnoses: Term Pregnancy-delivered  Discharge Information: Date: 12/16/2012 Activity: pelvic rest Diet: routine Medications: Ibuprofen, Colace and Percocet Condition: improved Instructions: refer to practice specific booklet Discharge to: home Follow-up Information   Follow up with Almon Hercules., MD In 4 weeks. (For a postpartum evaluation)    Contact information:   [address] [place] [address] [phone]       Newborn Data: Live born female  Birth Weight: 7 lb 15.2 oz (3605 g) APGAR: 9, 9  Home with mother.  Marlenne Ridge H. 12/16/2012, 10:03 AM

## 2012-12-20 ENCOUNTER — Inpatient Hospital Stay (HOSPITAL_COMMUNITY): Admission: AD | Admit: 2012-12-20 | Payer: Self-pay | Source: Ambulatory Visit | Admitting: Obstetrics and Gynecology

## 2014-03-04 ENCOUNTER — Other Ambulatory Visit: Payer: Self-pay | Admitting: Obstetrics and Gynecology

## 2014-03-04 LAB — OB RESULTS CONSOLE HEPATITIS B SURFACE ANTIGEN: Hepatitis B Surface Ag: NEGATIVE

## 2014-03-04 LAB — OB RESULTS CONSOLE RUBELLA ANTIBODY, IGM: Rubella: IMMUNE

## 2014-03-04 LAB — OB RESULTS CONSOLE RPR: RPR: NONREACTIVE

## 2014-03-04 LAB — OB RESULTS CONSOLE ABO/RH: RH Type: POSITIVE

## 2014-03-04 LAB — OB RESULTS CONSOLE ANTIBODY SCREEN: Antibody Screen: NEGATIVE

## 2014-03-04 LAB — OB RESULTS CONSOLE HIV ANTIBODY (ROUTINE TESTING): HIV: NONREACTIVE

## 2014-03-05 LAB — CYTOLOGY - PAP

## 2014-03-10 ENCOUNTER — Encounter (HOSPITAL_COMMUNITY): Payer: Self-pay

## 2014-08-21 ENCOUNTER — Other Ambulatory Visit: Payer: Self-pay | Admitting: Obstetrics and Gynecology

## 2014-09-08 ENCOUNTER — Encounter (HOSPITAL_COMMUNITY): Payer: Self-pay

## 2014-09-08 NOTE — Patient Instructions (Addendum)
   Your procedure is scheduled on: Thursday, May 5  Enter through the Main Entrance of [facility] at: 9:30 AM Pick up the phone at the desk and dial [phone] and inform us of your arrival.  Please call this number if you have any problems the morning of surgery: [phone]  Remember: Do not eat or drink after midnight: Wednesday Take these medicines the morning of surgery with a SIP OF WATER:  NONE  Do not wear jewelry, make-up, or FINGER nail polish No metal in your hair or on your body. Do not wear lotions, powders, perfumes.  You may wear deodorant.  Do not bring valuables to the hospital. Contacts, dentures or bridgework may not be worn into surgery.  Leave suitcase in the car. After Surgery it may be brought to your room. For patients being admitted to the hospital, checkout time is 11:00am the day of discharge.  Home with husband Quillian Quince cell [phone]

## 2014-09-09 ENCOUNTER — Encounter (HOSPITAL_COMMUNITY): Payer: Self-pay

## 2014-09-09 ENCOUNTER — Encounter (HOSPITAL_COMMUNITY)
Admission: RE | Admit: 2014-09-09 | Discharge: 2014-09-09 | Disposition: A | Discharge status: Home / Self Care | Payer: BLUE CROSS/BLUE SHIELD | Source: Ambulatory Visit | Attending: Obstetrics and Gynecology | Admitting: Obstetrics and Gynecology

## 2014-09-09 LAB — CBC
HCT: 37.7 % (ref 36.0–46.0)
HEMOGLOBIN: 12.3 g/dL (ref 12.0–15.0)
MCH: 28.8 pg (ref 26.0–34.0)
MCHC: 32.6 g/dL (ref 30.0–36.0)
MCV: 88.3 fL (ref 78.0–100.0)
Platelets: 190 10*3/uL (ref 150–400)
RBC: 4.27 MIL/uL (ref 3.87–5.11)
RDW: 14.4 % (ref 11.5–15.5)
WBC: 7.6 10*3/uL (ref 4.0–10.5)

## 2014-09-10 LAB — RPR: RPR Ser Ql: NONREACTIVE

## 2014-09-10 MED ORDER — CEFAZOLIN SODIUM-DEXTROSE 2-3 GM-% IV SOLR
2.0000 g | INTRAVENOUS | Status: AC
  Administered 2014-09-11: 2 g via INTRAVENOUS

## 2014-09-10 NOTE — Anesthesia Preprocedure Evaluation (Addendum)
Anesthesia Evaluation  Patient identified by MRN, date of birth, ID band Patient awake    Reviewed: Allergy & Precautions, NPO status , Patient's Chart, lab work & pertinent test results, reviewed documented beta blocker date and time   Airway Mallampati: II   Neck ROM: Full    Dental  (+) Teeth Intact   Pulmonary neg pulmonary ROS,  breath sounds clear to auscultation        Cardiovascular hypertension, Rhythm:Regular     Neuro/Psych negative neurological ROS  negative psych ROS   GI/Hepatic negative GI ROS, Neg liver ROS,   Endo/Other  negative endocrine ROS  Renal/GU negative Renal ROS     Musculoskeletal negative musculoskeletal ROS (+)   Abdominal (+)  Abdomen: soft.    Peds  Hematology 12/37 plts 190   Anesthesia Other Findings   Reproductive/Obstetrics (+) Pregnancy Posterior placenta previa                            Anesthesia Physical Anesthesia Plan  ASA: II  Anesthesia Plan: Spinal   Post-op Pain Management:    Induction:   Airway Management Planned: Natural Airway  Additional Equipment:   Intra-op Plan:   Post-operative Plan:   Informed Consent: I have reviewed the patients History and Physical, chart, labs and discussed the procedure including the risks, benefits and alternatives for the proposed anesthesia with the patient or authorized representative who has indicated his/her understanding and acceptance.     Plan Discussed with:   Anesthesia Plan Comments: (Posterior placenta previa, 2 IV 18 or better, verify T&C, have blood in room, Had epidural 2014 insertion depth 4.5cm)        Anesthesia Quick Evaluation

## 2014-09-11 ENCOUNTER — Encounter (HOSPITAL_COMMUNITY): Payer: Self-pay | Admitting: Emergency Medicine

## 2014-09-11 ENCOUNTER — Encounter (HOSPITAL_COMMUNITY)
Admission: RE | Disposition: A | Discharge status: Home / Self Care | Payer: Self-pay | Source: Ambulatory Visit | Attending: Obstetrics and Gynecology

## 2014-09-11 ENCOUNTER — Inpatient Hospital Stay (HOSPITAL_COMMUNITY): Payer: BLUE CROSS/BLUE SHIELD | Admitting: Anesthesiology

## 2014-09-11 ENCOUNTER — Inpatient Hospital Stay (HOSPITAL_COMMUNITY)
Admission: RE | Admit: 2014-09-11 | Discharge: 2014-09-14 | DRG: 766 | Disposition: A | Discharge status: Home / Self Care | Payer: BLUE CROSS/BLUE SHIELD | Source: Ambulatory Visit | Attending: Obstetrics and Gynecology | Admitting: Obstetrics and Gynecology

## 2014-09-11 DIAGNOSIS — Z823 Family history of stroke: Secondary | ICD-10-CM

## 2014-09-11 DIAGNOSIS — Z3A39 39 weeks gestation of pregnancy: Secondary | ICD-10-CM | POA: Diagnosis present

## 2014-09-11 DIAGNOSIS — Z8249 Family history of ischemic heart disease and other diseases of the circulatory system: Secondary | ICD-10-CM

## 2014-09-11 DIAGNOSIS — O09523 Supervision of elderly multigravida, third trimester: Secondary | ICD-10-CM

## 2014-09-11 DIAGNOSIS — O4403 Placenta previa specified as without hemorrhage, third trimester: Principal | ICD-10-CM | POA: Diagnosis present

## 2014-09-11 DIAGNOSIS — O09813 Supervision of pregnancy resulting from assisted reproductive technology, third trimester: Secondary | ICD-10-CM | POA: Diagnosis not present

## 2014-09-11 DIAGNOSIS — D280 Benign neoplasm of vulva: Secondary | ICD-10-CM | POA: Diagnosis present

## 2014-09-11 DIAGNOSIS — Z833 Family history of diabetes mellitus: Secondary | ICD-10-CM | POA: Diagnosis not present

## 2014-09-11 LAB — PREPARE RBC (CROSSMATCH)

## 2014-09-11 SURGERY — Surgical Case
Anesthesia: Spinal | Site: Abdomen

## 2014-09-11 MED ORDER — MORPHINE SULFATE (PF) 0.5 MG/ML IJ SOLN
INTRAMUSCULAR | Status: DC | PRN
  Administered 2014-09-11: .2 mg via EPIDURAL

## 2014-09-11 MED ORDER — LANOLIN HYDROUS EX OINT: 1.0000 "application " | TOPICAL_OINTMENT | CUTANEOUS | Status: DC | PRN

## 2014-09-11 MED ORDER — OXYCODONE-ACETAMINOPHEN 5-325 MG PO TABS: 1.0000 | ORAL_TABLET | ORAL | Status: DC | PRN

## 2014-09-11 MED ORDER — BUPIVACAINE LIPOSOME 1.3 % IJ SUSP
20.0000 mL | Freq: Once | INTRAMUSCULAR | Status: AC
  Administered 2014-09-11: 20 mL
  Filled 2014-09-11: qty 20

## 2014-09-11 MED ORDER — CEFAZOLIN SODIUM-DEXTROSE 2-3 GM-% IV SOLR
INTRAVENOUS | Status: AC
  Filled 2014-09-11: qty 50

## 2014-09-11 MED ORDER — SCOPOLAMINE 1 MG/3DAYS TD PT72
1.0000 | MEDICATED_PATCH | Freq: Once | TRANSDERMAL | Status: DC
  Filled 2014-09-11: qty 1

## 2014-09-11 MED ORDER — OXYTOCIN 10 UNIT/ML IJ SOLN
INTRAMUSCULAR | Status: AC
  Filled 2014-09-11: qty 4

## 2014-09-11 MED ORDER — SIMETHICONE 80 MG PO CHEW
80.0000 mg | CHEWABLE_TABLET | ORAL | Status: DC | PRN
Start: 2014-09-11 — End: 2014-09-14

## 2014-09-11 MED ORDER — MEPERIDINE HCL 25 MG/ML IJ SOLN: 6.2500 mg | INTRAMUSCULAR | Status: DC | PRN

## 2014-09-11 MED ORDER — OXYTOCIN 40 UNITS IN LACTATED RINGERS INFUSION - SIMPLE MED: 62.5000 mL/h | INTRAVENOUS | Status: AC

## 2014-09-11 MED ORDER — LACTATED RINGERS IV SOLN
INTRAVENOUS | Status: DC
  Administered 2014-09-12: via INTRAVENOUS

## 2014-09-11 MED ORDER — PHENYLEPHRINE 40 MCG/ML (10ML) SYRINGE FOR IV PUSH (FOR BLOOD PRESSURE SUPPORT)
PREFILLED_SYRINGE | INTRAVENOUS | Status: AC
  Filled 2014-09-11: qty 10

## 2014-09-11 MED ORDER — NALBUPHINE HCL 10 MG/ML IJ SOLN: 5.0000 mg | INTRAMUSCULAR | Status: DC | PRN

## 2014-09-11 MED ORDER — LACTATED RINGERS IV SOLN
40.0000 [IU] | INTRAVENOUS | Status: DC | PRN
  Administered 2014-09-11: 40 [IU] via INTRAVENOUS

## 2014-09-11 MED ORDER — DIBUCAINE 1 % RE OINT: 1.0000 "application " | TOPICAL_OINTMENT | RECTAL | Status: DC | PRN

## 2014-09-11 MED ORDER — NALBUPHINE HCL 10 MG/ML IJ SOLN: 5.0000 mg | Freq: Once | INTRAMUSCULAR | Status: AC | PRN

## 2014-09-11 MED ORDER — DIPHENHYDRAMINE HCL 25 MG PO CAPS: 25.0000 mg | ORAL_CAPSULE | ORAL | Status: DC | PRN

## 2014-09-11 MED ORDER — TETANUS-DIPHTH-ACELL PERTUSSIS 5-2.5-18.5 LF-MCG/0.5 IM SUSP
0.5000 mL | Freq: Once | INTRAMUSCULAR | Status: AC
  Administered 2014-09-12: 0.5 mL via INTRAMUSCULAR
  Filled 2014-09-11: qty 0.5

## 2014-09-11 MED ORDER — DIPHENHYDRAMINE HCL 50 MG/ML IJ SOLN: 12.5000 mg | INTRAMUSCULAR | Status: DC | PRN

## 2014-09-11 MED ORDER — IBUPROFEN 600 MG PO TABS
600.0000 mg | ORAL_TABLET | Freq: Four times a day (QID) | ORAL | Status: DC
  Administered 2014-09-11 – 2014-09-14 (×10): 600 mg via ORAL
  Filled 2014-09-11 (×10): qty 1

## 2014-09-11 MED ORDER — FENTANYL CITRATE (PF) 100 MCG/2ML IJ SOLN
INTRAMUSCULAR | Status: AC
  Filled 2014-09-11: qty 2

## 2014-09-11 MED ORDER — LACTATED RINGERS IV SOLN
INTRAVENOUS | Status: DC
  Administered 2014-09-11: 12:00:00 via INTRAVENOUS

## 2014-09-11 MED ORDER — KETOROLAC TROMETHAMINE 30 MG/ML IJ SOLN
30.0000 mg | Freq: Once | INTRAMUSCULAR | Status: AC
  Administered 2014-09-11: 30 mg via INTRAMUSCULAR

## 2014-09-11 MED ORDER — SIMETHICONE 80 MG PO CHEW
80.0000 mg | CHEWABLE_TABLET | Freq: Three times a day (TID) | ORAL | Status: DC
  Administered 2014-09-12 – 2014-09-14 (×6): 80 mg via ORAL
  Filled 2014-09-11 (×6): qty 1

## 2014-09-11 MED ORDER — SCOPOLAMINE 1 MG/3DAYS TD PT72
1.0000 | MEDICATED_PATCH | Freq: Once | TRANSDERMAL | Status: DC
  Administered 2014-09-11: 1.5 mg via TRANSDERMAL

## 2014-09-11 MED ORDER — PHENYLEPHRINE 8 MG IN D5W 100 ML (0.08MG/ML) PREMIX OPTIME
INJECTION | INTRAVENOUS | Status: DC | PRN
  Administered 2014-09-11: 60 ug/min via INTRAVENOUS

## 2014-09-11 MED ORDER — ZOLPIDEM TARTRATE 5 MG PO TABS: 5.0000 mg | ORAL_TABLET | Freq: Every evening | ORAL | Status: DC | PRN

## 2014-09-11 MED ORDER — SODIUM CHLORIDE 0.9 % IV SOLN: 10.0000 mL/h | Freq: Once | INTRAVENOUS | Status: DC

## 2014-09-11 MED ORDER — NALOXONE HCL 0.4 MG/ML IJ SOLN: 0.4000 mg | INTRAMUSCULAR | Status: DC | PRN

## 2014-09-11 MED ORDER — NALOXONE HCL 1 MG/ML IJ SOLN
1.0000 ug/kg/h | INTRAVENOUS | Status: DC | PRN
  Filled 2014-09-11: qty 2

## 2014-09-11 MED ORDER — FENTANYL CITRATE (PF) 100 MCG/2ML IJ SOLN: 25.0000 ug | INTRAMUSCULAR | Status: DC | PRN

## 2014-09-11 MED ORDER — PROMETHAZINE HCL 25 MG/ML IJ SOLN
6.2500 mg | INTRAMUSCULAR | Status: DC | PRN
Start: 2014-09-11 — End: 2014-09-11

## 2014-09-11 MED ORDER — PHENYLEPHRINE 8 MG IN D5W 100 ML (0.08MG/ML) PREMIX OPTIME
INJECTION | INTRAVENOUS | Status: AC
  Filled 2014-09-11: qty 100

## 2014-09-11 MED ORDER — BUPIVACAINE HCL (PF) 0.25 % IJ SOLN
INTRAMUSCULAR | Status: AC
  Filled 2014-09-11: qty 30

## 2014-09-11 MED ORDER — ONDANSETRON HCL 4 MG/2ML IJ SOLN
INTRAMUSCULAR | Status: AC
Start: 2014-09-11 — End: 2014-09-11
  Filled 2014-09-11: qty 2

## 2014-09-11 MED ORDER — LACTATED RINGERS IV SOLN
Freq: Once | INTRAVENOUS | Status: AC
  Administered 2014-09-11 (×3): via INTRAVENOUS

## 2014-09-11 MED ORDER — NALBUPHINE HCL 10 MG/ML IJ SOLN
5.0000 mg | INTRAMUSCULAR | Status: DC | PRN
Start: 2014-09-11 — End: 2014-09-14
  Administered 2014-09-11 – 2014-09-12 (×2): 5 mg via INTRAVENOUS
  Filled 2014-09-11: qty 1

## 2014-09-11 MED ORDER — SENNOSIDES-DOCUSATE SODIUM 8.6-50 MG PO TABS
2.0000 | ORAL_TABLET | ORAL | Status: DC
  Administered 2014-09-11 – 2014-09-13 (×3): 2 via ORAL
  Filled 2014-09-11 (×4): qty 2

## 2014-09-11 MED ORDER — FENTANYL CITRATE (PF) 100 MCG/2ML IJ SOLN
INTRAMUSCULAR | Status: DC | PRN
  Administered 2014-09-11: 20 ug via INTRAVENOUS

## 2014-09-11 MED ORDER — PRENATAL MULTIVITAMIN CH
1.0000 | ORAL_TABLET | Freq: Every day | ORAL | Status: DC
  Administered 2014-09-12 – 2014-09-13 (×2): 1 via ORAL
  Filled 2014-09-11 (×2): qty 1

## 2014-09-11 MED ORDER — ACETAMINOPHEN 325 MG PO TABS: 650.0000 mg | ORAL_TABLET | ORAL | Status: DC | PRN

## 2014-09-11 MED ORDER — ONDANSETRON HCL 4 MG/2ML IJ SOLN: 4.0000 mg | Freq: Three times a day (TID) | INTRAMUSCULAR | Status: DC | PRN

## 2014-09-11 MED ORDER — KETOROLAC TROMETHAMINE 30 MG/ML IJ SOLN: 30.0000 mg | Freq: Four times a day (QID) | INTRAMUSCULAR | Status: AC | PRN

## 2014-09-11 MED ORDER — OXYCODONE-ACETAMINOPHEN 5-325 MG PO TABS
2.0000 | ORAL_TABLET | ORAL | Status: DC | PRN
  Administered 2014-09-12 – 2014-09-14 (×7): 2 via ORAL
  Filled 2014-09-11 (×7): qty 2

## 2014-09-11 MED ORDER — ONDANSETRON HCL 4 MG/2ML IJ SOLN
INTRAMUSCULAR | Status: DC | PRN
  Administered 2014-09-11: 4 mg via INTRAVENOUS

## 2014-09-11 MED ORDER — SODIUM CHLORIDE 0.9 % IJ SOLN: 3.0000 mL | INTRAMUSCULAR | Status: DC | PRN

## 2014-09-11 MED ORDER — BUPIVACAINE HCL 0.25 % IJ SOLN
INTRAMUSCULAR | Status: DC | PRN
  Administered 2014-09-11: 30 mL

## 2014-09-11 MED ORDER — BUPIVACAINE IN DEXTROSE 0.75-8.25 % IT SOLN
INTRATHECAL | Status: DC | PRN
  Administered 2014-09-11: 1.75 mL via INTRATHECAL

## 2014-09-11 MED ORDER — MORPHINE SULFATE 0.5 MG/ML IJ SOLN
INTRAMUSCULAR | Status: AC
  Filled 2014-09-11: qty 10

## 2014-09-11 MED ORDER — KETOROLAC TROMETHAMINE 30 MG/ML IJ SOLN
INTRAMUSCULAR | Status: AC
  Filled 2014-09-11: qty 1

## 2014-09-11 MED ORDER — NALBUPHINE HCL 10 MG/ML IJ SOLN
5.0000 mg | Freq: Once | INTRAMUSCULAR | Status: AC | PRN
  Filled 2014-09-11: qty 1

## 2014-09-11 MED ORDER — ACETAMINOPHEN 500 MG PO TABS
1000.0000 mg | ORAL_TABLET | Freq: Four times a day (QID) | ORAL | Status: AC
  Administered 2014-09-11 – 2014-09-12 (×3): 1000 mg via ORAL
  Filled 2014-09-11 (×3): qty 2

## 2014-09-11 MED ORDER — MENTHOL 3 MG MT LOZG: 1.0000 | LOZENGE | OROMUCOSAL | Status: DC | PRN

## 2014-09-11 MED ORDER — SCOPOLAMINE 1 MG/3DAYS TD PT72
MEDICATED_PATCH | TRANSDERMAL | Status: AC
  Administered 2014-09-11: 1.5 mg via TRANSDERMAL
  Filled 2014-09-11: qty 1

## 2014-09-11 MED ORDER — WITCH HAZEL-GLYCERIN EX PADS: 1.0000 "application " | MEDICATED_PAD | CUTANEOUS | Status: DC | PRN

## 2014-09-11 MED ORDER — DIPHENHYDRAMINE HCL 25 MG PO CAPS: 25.0000 mg | ORAL_CAPSULE | Freq: Four times a day (QID) | ORAL | Status: DC | PRN

## 2014-09-11 MED ORDER — SIMETHICONE 80 MG PO CHEW
80.0000 mg | CHEWABLE_TABLET | ORAL | Status: DC
  Administered 2014-09-11 – 2014-09-13 (×3): 80 mg via ORAL
  Filled 2014-09-11 (×3): qty 1

## 2014-09-11 SURGICAL SUPPLY — 31 items
CLAMP CORD UMBIL (MISCELLANEOUS) IMPLANT
CLOTH BEACON ORANGE TIMEOUT ST (SAFETY) ×3 IMPLANT
DRAPE SHEET LG 3/4 BI-LAMINATE (DRAPES) IMPLANT
DRSG OPSITE POSTOP 4X10 (GAUZE/BANDAGES/DRESSINGS) ×3 IMPLANT
DURAPREP 26ML APPLICATOR (WOUND CARE) ×3 IMPLANT
ELECT REM PT RETURN 9FT ADLT (ELECTROSURGICAL) ×3
ELECTRODE REM PT RTRN 9FT ADLT (ELECTROSURGICAL) ×1 IMPLANT
EXTRACTOR VACUUM M CUP 4 TUBE (SUCTIONS) IMPLANT
EXTRACTOR VACUUM M CUP 4' TUBE (SUCTIONS)
GLOVE BIO SURGEON STRL SZ7 (GLOVE) ×3 IMPLANT
GOWN STRL REUS W/TWL LRG LVL3 (GOWN DISPOSABLE) ×6 IMPLANT
KIT ABG SYR 3ML LUER SLIP (SYRINGE) IMPLANT
LIQUID BAND (GAUZE/BANDAGES/DRESSINGS) ×6 IMPLANT
NEEDLE HYPO 22GX1.5 SAFETY (NEEDLE) IMPLANT
NEEDLE HYPO 25X5/8 SAFETYGLIDE (NEEDLE) IMPLANT
NS IRRIG 1000ML POUR BTL (IV SOLUTION) ×3 IMPLANT
PACK C SECTION WH (CUSTOM PROCEDURE TRAY) ×3 IMPLANT
PAD OB MATERNITY 4.3X12.25 (PERSONAL CARE ITEMS) ×3 IMPLANT
RTRCTR C-SECT PINK 25CM LRG (MISCELLANEOUS) ×3 IMPLANT
SPONGE LAP 18X18 X RAY DECT (DISPOSABLE) ×3 IMPLANT
STAPLER VISISTAT 35W (STAPLE) IMPLANT
SUT CHROMIC 1 CTX 36 (SUTURE) ×6 IMPLANT
SUT CHROMIC 2 0 CT 1 (SUTURE) ×3 IMPLANT
SUT PDS AB 0 CTX 60 (SUTURE) ×3 IMPLANT
SUT VIC AB 2-0 CT1 (SUTURE) ×3 IMPLANT
SUT VIC AB 2-0 CT1 27 (SUTURE) ×2
SUT VIC AB 2-0 CT1 TAPERPNT 27 (SUTURE) ×1 IMPLANT
SUT VIC AB 4-0 KS 27 (SUTURE) ×3 IMPLANT
SYR 30ML LL (SYRINGE) IMPLANT
TOWEL OR 17X24 6PK STRL BLUE (TOWEL DISPOSABLE) ×3 IMPLANT
TRAY FOLEY CATH SILVER 14FR (SET/KITS/TRAYS/PACK) ×3 IMPLANT

## 2014-09-11 NOTE — Anesthesia Postprocedure Evaluation (Signed)
  Anesthesia Post-op Note  Patient: Paula Rocha  Procedure(s) Performed: Procedure(s) with comments: CESAREAN SECTION (N/A) - EDC:09/18/14   Patient Location: PACU  Anesthesia Type:Spinal  Level of Consciousness: awake and alert   Airway and Oxygen Therapy: Patient Spontanous Breathing  Post-op Pain: none  Post-op Assessment: Post-op Vital signs reviewed  Post-op Vital Signs: Reviewed and stable  Last Vitals:  Filed Vitals:   09/11/14 0954  BP: 127/87  Pulse: 90  Temp: 36.8 C  Resp: 16    Complications: No apparent anesthesia complications

## 2014-09-11 NOTE — H&P (Signed)
Paula Rocha is a 49 y.o. female presenting for Primary Cesarean Section  49 yo G3P2002 @ 39+0 presents for primary cesarean section for posterior placenta previa. She has had an uncomplicated pregnancy to this point.This is her 3rd pregnancy conceived by IVF with donor eggs. All same donor. History OB History    Gravida Para Term Preterm AB TAB SAB Ectopic Multiple Living   3 2 2  0 0 0 0 0 0 2     Past Medical History  Diagnosis Date  . No pertinent past medical history   . SVD (spontaneous vaginal delivery) 1224,8250    x 2 at [facility]   Past Surgical History  Procedure Laterality Date  . Dilation and curettage of uterus  05/2010    remove cervical polyps  . Wisdom tooth extraction     Family History: family history includes Diabetes in her maternal grandmother; Hypertension in her father; Stroke in her father. Social History:  reports that she has never smoked. She has never used smokeless tobacco. She reports that she does not drink alcohol or use illicit drugs.   Prenatal Transfer Tool  Maternal Diabetes: No Genetic Screening: Normal Maternal Ultrasounds/Referrals: Normal except placenta previa. Fetal Ultrasounds or other Referrals:  None Maternal Substance Abuse:  No Significant Maternal Medications:  None Significant Maternal Lab Results:  None Other Comments:  None  ROS: As above    Blood pressure 127/87, pulse 90, temperature 98.2 F (36.8 C), temperature source Oral, resp. rate 16, SpO2 99 %, unknown if currently breastfeeding. Exam Physical Exam  Prenatal labs: ABO, Rh: --/--/A POS (05/03 1445) Antibody: NEG (05/03 1445) Rubella: Immune (10/27 0000) RPR: Non Reactive (05/03 1445)  HBsAg: Negative (10/27 0000)  HIV: Non-reactive (10/27 0000)  GBS:     Assessment/Plan: 1) Admit 2) Consent for cesarean. R/B/A reviewed  3) Abx OCTOR 4) SCDs 5) T & C for 2 units given previa  Paula Rocha H. 09/11/2014, 10:09 AM

## 2014-09-11 NOTE — Anesthesia Procedure Notes (Signed)
Spinal Patient location during procedure: OR Start time: 09/11/2014 10:55 AM End time: 09/11/2014 11:08 AM Staffing Anesthesiologist: Alexis Frock Performed by: anesthesiologist  Preanesthetic Checklist Completed: patient identified, site marked, surgical consent, pre-op evaluation, timeout performed, IV checked, risks and benefits discussed and monitors and equipment checked Spinal Block Patient position: sitting Prep: site prepped and draped and DuraPrep Patient monitoring: heart rate, continuous pulse ox and blood pressure Approach: midline Location: L3-4 Injection technique: single-shot Needle Needle gauge: 24 G Needle length: 9 cm Needle insertion depth: 5 cm Assessment Sensory level: T3 Additional Notes Spinal .75% marcaine 1.21ml, fent 73mcg, morphine .2mg , no paresthesias, no complications FFCSF

## 2014-09-11 NOTE — Lactation Note (Addendum)
This note was copied from the chart of [place]. Lactation Consultation Note  Patient Name: Paula Rocha PVXYI'[id number] Date: 09/11/2014 Reason for consult: Initial assessment   Initial consult at 76 hours old.  GA-39.0; BW 8 lbs, 9 oz.  C-Section.  Mom is a P3 with experience breastfeeding 2 previous children for 4-6 months; pain with BF; Inverted nipples; mom reports using NS with both previous BF experiences. Infant has only been bottle fed since birth x2 (20-50 ml); voids-1; stools-1. After receiving bath infant began showing feeding cues and was STS with mom when [facility] visited.   Taught hand expression with no colostrum observed after a few attempts; infant was cuing to feed. Attempted latching but infant could not stay latched.  Started #20 nipples shield in cross-cradle hold on right side.  After several attempts baby was able to latch with depth and stayed latched for several minutes before pulling off and then re-latching.  Taught dad how to assist with latching using teacup hold.  Colostrum moistened inside of shield but did not hear consistent swallows.  Fed for 20+ minutes (infant was still feeding when Combes left room). Educated on importance of latching deeply for milk transfer.  Educated to always breastfeed first prior to offering formula; encouraged feeding on first side for as long as infant wants to feed and then offering second side.  Educated on risks of giving formula interfering with infant learning to breastfeed, and effects formula can cause on milk supply.  Day of Life supplementation guideline given. Educated on feeding cues, size of infant's stomach, and cluster feeding behaviors.  Hand pump also given and shown how to use for helping start flow of milk prior to latching.   Lactation brochure given and informed of hospital support group and outpatient services.  Encouraged to call RN during the night to check latch.  Spoke with RN about checking for milk in nipple shield and  assisting mom with latching.    Maternal Data Formula Feeding for Exclusion: Yes Reason for exclusion: Mother's choice to formula and breast feed on admission Has patient been taught Hand Expression?: Yes Does the patient have breastfeeding experience prior to this delivery?: Yes  Feeding Feeding Type: Breast Fed  LATCH Score/Interventions Latch: Grasps breast easily, tongue down, lips flanged, rhythmical sucking.  Audible Swallowing: A few with stimulation  Type of Nipple: Inverted  Comfort (Breast/Nipple): Soft / non-tender     Hold (Positioning): Assistance needed to correctly position infant at breast and maintain latch. Intervention(s): Breastfeeding basics reviewed;Support Pillows;Skin to skin  LATCH Score: 6  Lactation Tools Discussed/Used WIC Program: No   Consult Status Consult Status: Follow-up Date: 09/12/14 Follow-up type: In-patient    Merlene Laughter 09/11/2014, 9:14 PM

## 2014-09-11 NOTE — Consult Note (Signed)
Neonatology Note:   Attendance at C-section:    I was asked by Dr. Harrington Challenger to attend this primary C/S at term due to placenta previa. The mother is a G3P2 A pos, GBS neg with IVF pregnancy. ROM at delivery, fluid clear. Infant vigorous with good spontaneous cry and tone. Needed no suctioning. Ap 9/9. Lungs clear to ausc in DR. Small preauricular skin tag noted on left, shown to parents. To CN to care of Pediatrician.   Real Cons, MD

## 2014-09-11 NOTE — Transfer of Care (Signed)
Immediate Anesthesia Transfer of Care Note  Patient: Paula Rocha  Procedure(s) Performed: Procedure(s) with comments: CESAREAN SECTION (N/A) - EDC:09/18/14   Patient Location: PACU  Anesthesia Type:Spinal  Level of Consciousness: awake, alert  and oriented  Airway & Oxygen Therapy: Patient Spontanous Breathing  Post-op Assessment: Report given to RN and Post -op Vital signs reviewed and stable  Post vital signs: Reviewed and stable  Last Vitals:  Filed Vitals:   09/11/14 0954  BP: 127/87  Pulse: 90  Temp: 36.8 C  Resp: 16    Complications: No apparent anesthesia complications

## 2014-09-11 NOTE — Op Note (Signed)
Pre-Operative Diagnosis: 1) 39 week intrauterine pregnancy 2) Posterior complete previa 3) Vulvar Nevus Postoperative Diagnosis: Same Procedure: Primary low transverse cesarean section, excision of mons pubis nevus, and  subcutaneous injection of Exparel. Surgeon: Dr. Vanessa Kick Assistant: Dr. Jerelyn Charles Operative Findings: Vigorous female infant in the vertex presentation with apgars of 8 & 9 weighing 8#9oz. Normal ovaries and tubes. Prominent uterine vasculature on the left hand aspect of the lower uterine segment.  Specimen: Placenta and mons pubis nevus EBL: 1000 cc  Procedure:Paula Rocha is an 49 year old gravida 3 para 2002 at 17 weeks and 0 days estimated gestational age who presents for cesarean section. The patient has a known placenta previa that has been without episodes of hemorrhage.  When the patient was prepped for surgery it was noted that a nevus was present in the site of the planned surgical incision and the patient was verbally consented for removal. Following the appropriate informed consent the patient was brought to the operating room where spinal anesthesia was administered and found to be adequate. She was placed in the dorsal supine position with a leftward tilt. She was prepped and draped in the normal sterile fashion. A scalpel was used to remove the nevus with a shave biopsy.  Scalpel was then used to make a Pfannenstiel skin incision which was carried down to the underlying layers of soft tissue to the fascia. The fascia was incised in the midline and the fascial incision was extended laterally with Mayo scissors. The superior aspect of the fascial incision was grasped with Coker clamps x2, tented up and the rectus muscles dissected off sharply with the electrocautery unit area and the same procedure was repeated on the inferior aspect of the fascial incision. The rectus muscles were separated in the midline. The abdominal peritoneum was identified, tented up, entered sharply,  and the incision was extended superiorly and inferiorly with good visualization of the bladder. The Alexis retractor was then deployed. The vesicouterine peritoneum was identified, tented up, entered sharply, and the bladder flap was created digitally. Scalpel was then used to make a low transverse incision on the uterus which was extended laterally with blunt dissection. The fetal vertex was identified, delivered easily through the uterine incision followed by the body. The infant was bulb suctioned on the operative field cried vigorously, cord was clamped and cut and the infant was passed to the waiting neonatologist. Placenta was then delivered spontaneously, the uterus was cleared of all clot and debris. The uterine incision was repaired with #1 chromic in running locked fashion. Ovaries and tubes were inspected and normal. The Alexis retractor was removed. The uterus was returned to the abdominal cavity the abdominal cavity was cleared of all clot and debris. The abdominal peritoneum was reapproximated with 2-0 Vicryl in a running fashion, the rectus muscles was reapproximated with 2-0 chromic in a running fashion. The fascia was closed with a looped PDS in a running fashion. The skin was closed with 4-0 vicryl in a subcuticular fashion and surgical skin glue. All sponge lap and needle counts were correct x2. Patient tolerated the procedure well and recovered in stable condition following the procedure.

## 2014-09-12 LAB — BIRTH TISSUE RECOVERY COLLECTION (PLACENTA DONATION)

## 2014-09-12 LAB — CBC
HCT: 33.5 % — ABNORMAL LOW (ref 36.0–46.0)
HEMOGLOBIN: 10.7 g/dL — AB (ref 12.0–15.0)
MCH: 28.2 pg (ref 26.0–34.0)
MCHC: 31.9 g/dL (ref 30.0–36.0)
MCV: 88.2 fL (ref 78.0–100.0)
Platelets: 185 10*3/uL (ref 150–400)
RBC: 3.8 MIL/uL — AB (ref 3.87–5.11)
RDW: 14.3 % (ref 11.5–15.5)
WBC: 10.9 10*3/uL — ABNORMAL HIGH (ref 4.0–10.5)

## 2014-09-12 NOTE — Progress Notes (Signed)
POD#1 Pt without complaints.  VSSAF IMP/ doing well Plan/ routine care.

## 2014-09-12 NOTE — Anesthesia Postprocedure Evaluation (Signed)
Anesthesia Post Note  Patient: Paula Rocha  Procedure(s) Performed: Procedure(s) (LRB): CESAREAN SECTION (N/A)  Anesthesia type: Spinal  Patient location: Mother/Baby  Post pain: Pain level controlled  Post assessment: Post-op Vital signs reviewed  Last Vitals:  Filed Vitals:   09/12/14 0619  BP: 109/60  Pulse: 75  Temp: 36.6 C  Resp: 18    Post vital signs: Reviewed  Level of consciousness: awake  Complications: No apparent anesthesia complications

## 2014-09-12 NOTE — Addendum Note (Signed)
Addendum  created 09/12/14 1540 by Asher Muir, CRNA   Modules edited: Notes Section   Notes Section:  File: [id number]

## 2014-09-13 ENCOUNTER — Encounter (HOSPITAL_COMMUNITY): Payer: Self-pay | Admitting: Obstetrics and Gynecology

## 2014-09-13 LAB — TYPE AND SCREEN
ABO/RH(D): A POS
ANTIBODY SCREEN: NEGATIVE
UNIT DIVISION: 0
UNIT DIVISION: 0
Unit division: 0
Unit division: 0

## 2014-09-13 NOTE — Progress Notes (Signed)
POD #2 Pt has no c/o VSSAF IMP/ stable Plan/ routine care.

## 2014-09-14 MED ORDER — OXYCODONE-ACETAMINOPHEN 5-325 MG PO TABS: 2.0000 | ORAL_TABLET | ORAL | Status: AC | PRN

## 2014-09-14 NOTE — Progress Notes (Signed)
POD#3 Pt without c/o VSSAF Imp/ doing well Plan/ Will discharge

## 2014-09-14 NOTE — Discharge Summary (Signed)
Obstetric Discharge Summary Reason for Admission: cesarean section Prenatal Procedures: ultrasound Intrapartum Procedures: cesarean: low cervical, transverse Postpartum Procedures: none Complications-Operative and Postpartum: none HEMOGLOBIN  Date Value Ref Range Status  09/12/2014 10.7* 12.0 - 15.0 g/dL Final   HCT  Date Value Ref Range Status  09/12/2014 33.5* 36.0 - 46.0 % Final    Physical Exam:  General: alert Lochia: appropriate Uterine Fundus: firm Incision: healing well DVT Evaluation: No evidence of DVT seen on physical exam.  Discharge Diagnoses: Term Pregnancy-delivered and placenta previa  Discharge Information: Date: 09/14/2014 Activity: pelvic rest Diet: routine Medications: PNV, Ibuprofen and Percocet Condition: stable Instructions: refer to practice specific booklet Discharge to: home Follow-up Information    Follow up with Marcial Pacas., MD. Schedule an appointment as soon as possible for a visit in 1 month.   Specialty:  Obstetrics and Gynecology   Contact information:   [place] [address] [phone]       Newborn Data: Live born female  Birth Weight: 8 lb 9 oz (3884 g) APGAR: 9, 9  Home with mother.  Paula Rocha E 09/14/2014, 8:30 AM

## 2014-09-14 NOTE — Lactation Note (Signed)
This note was copied from the chart of [place]. Lactation Consultation Note: experienced BF mom. Has baby latched to breast when I went into room. Reports he has been nursing for 1 hour. Asking for formula to supplement. Has given small amounts to top him off when he is still acting hungry. Reports breasts are feeling a little heavier this morning. Small amount of Colostrum noted in NS when he came off the breast. Mom using NS- used with other babies the entire time she was breast feeding, Reports lots of pain with nursing and with pumping with previous children. # 27 flanges given for mom to try with her Medela PIS . No questions at present. Offered OP appointment but mom wants to go home and see how things are going. Will call if wants appt.   Patient Name: Paula Rocha [id number] Date: 09/14/2014 Reason for consult: Follow-up assessment   Maternal Data Formula Feeding for Exclusion: Yes Reason for exclusion: Mother's choice to formula and breast feed on admission Has patient been taught Hand Expression?: Yes Does the patient have breastfeeding experience prior to this delivery?: Yes  Feeding Feeding Type: Breast Fed Length of feed: 60 min  LATCH Score/Interventions Latch: Grasps breast easily, tongue down, lips flanged, rhythmical sucking.  Audible Swallowing: None  Type of Nipple: Flat  Comfort (Breast/Nipple): Soft / non-tender  Problem noted: Filling  Hold (Positioning): No assistance needed to correctly position infant at breast.  LATCH Score: 7  Lactation Tools Discussed/Used Tools: Nipple Shields Nipple shield size: 20 WIC Program: No   Consult Status Consult Status: Complete    Truddie Crumble 09/14/2014, 8:38 AM

## 2014-10-07 ENCOUNTER — Other Ambulatory Visit: Payer: Self-pay | Admitting: Obstetrics and Gynecology

## 2014-10-08 LAB — CYTOLOGY - PAP

## 2016-06-30 ENCOUNTER — Other Ambulatory Visit: Payer: Self-pay | Admitting: Obstetrics and Gynecology

## 2016-07-01 LAB — CYTOLOGY - PAP
